# Patient Record
Sex: Male | Born: 1975 | Race: Black or African American | Hispanic: No | Marital: Married | Smoking: Never smoker
Health system: Southern US, Community
[De-identification: ages and names within clinical notes are randomized; demographics above are authoritative.]

## PROBLEM LIST (undated history)

## (undated) DIAGNOSIS — M7500 Adhesive capsulitis of unspecified shoulder: Secondary | ICD-10-CM

## (undated) DIAGNOSIS — E119 Type 2 diabetes mellitus without complications: Secondary | ICD-10-CM

## (undated) DIAGNOSIS — E785 Hyperlipidemia, unspecified: Secondary | ICD-10-CM

## (undated) HISTORY — DX: Hyperlipidemia, unspecified: E78.5

## (undated) HISTORY — DX: Type 2 diabetes mellitus without complications: E11.9

## (undated) HISTORY — DX: Adhesive capsulitis of unspecified shoulder: M75.00

## (undated) HISTORY — PX: BRAIN SURGERY: SHX531

---

## 2014-09-05 ENCOUNTER — Ambulatory Visit: Payer: Self-pay | Admitting: Internal Medicine

## 2014-10-25 ENCOUNTER — Encounter: Payer: Self-pay | Admitting: Internal Medicine

## 2014-10-25 ENCOUNTER — Ambulatory Visit (INDEPENDENT_AMBULATORY_CARE_PROVIDER_SITE_OTHER): Payer: 59 | Admitting: Internal Medicine

## 2014-10-25 VITALS — BP 110/76 | HR 88 | Temp 98.1°F | Resp 18 | Ht 76.0 in | Wt 193.0 lb

## 2014-10-25 DIAGNOSIS — R55 Syncope and collapse: Secondary | ICD-10-CM

## 2014-10-25 DIAGNOSIS — R1013 Epigastric pain: Secondary | ICD-10-CM | POA: Diagnosis not present

## 2014-10-25 DIAGNOSIS — M2550 Pain in unspecified joint: Secondary | ICD-10-CM

## 2014-10-25 DIAGNOSIS — R05 Cough: Secondary | ICD-10-CM | POA: Diagnosis not present

## 2014-10-25 DIAGNOSIS — R569 Unspecified convulsions: Secondary | ICD-10-CM

## 2014-10-25 DIAGNOSIS — E1169 Type 2 diabetes mellitus with other specified complication: Secondary | ICD-10-CM

## 2014-10-25 DIAGNOSIS — R059 Cough, unspecified: Secondary | ICD-10-CM

## 2014-10-25 DIAGNOSIS — Z8709 Personal history of other diseases of the respiratory system: Secondary | ICD-10-CM | POA: Diagnosis not present

## 2014-10-25 NOTE — Patient Instructions (Signed)
Will call with referral appt  Will call with lab/imaging results  Continue metformin for now. May need to adjust medication pending lab results  No flu shot given today due to uncontrolled seizures  Follow up in 1 month for CPE.

## 2014-10-25 NOTE — Addendum Note (Signed)
Addended byParticia Lather C on: 10/25/2014 10:22 AM   Modules accepted: Orders
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
## 2014-10-26 LAB — COMPREHENSIVE METABOLIC PANEL
ALBUMIN: 4.7 g/dL (ref 3.5–5.5)
ALK PHOS: 60 IU/L (ref 39–117)
ALT: 27 IU/L (ref 0–44)
AST: 17 IU/L (ref 0–40)
Albumin/Globulin Ratio: 1.6 (ref 1.1–2.5)
BUN/Creatinine Ratio: 12 (ref 8–19)
BUN: 9 mg/dL (ref 6–20)
Bilirubin Total: 0.3 mg/dL (ref 0.0–1.2)
CALCIUM: 9.5 mg/dL (ref 8.7–10.2)
CO2: 25 mmol/L (ref 18–29)
CREATININE: 0.78 mg/dL (ref 0.76–1.27)
Chloride: 97 mmol/L (ref 97–108)
GFR calc Af Amer: 133 mL/min/{1.73_m2} (ref >59)
GFR, EST NON AFRICAN AMERICAN: 115 mL/min/{1.73_m2} (ref >59)
GLUCOSE: 209 mg/dL — AB (ref 65–99)
Globulin, Total: 3 g/dL (ref 1.5–4.5)
Potassium: 4.3 mmol/L (ref 3.5–5.2)
Sodium: 139 mmol/L (ref 134–144)
Total Protein: 7.7 g/dL (ref 6.0–8.5)

## 2014-10-26 LAB — LIPID PANEL
CHOL/HDL RATIO: 6.2 ratio — AB (ref 0.0–5.0)
CHOLESTEROL TOTAL: 211 mg/dL — AB (ref 100–199)
HDL: 34 mg/dL — ABNORMAL LOW (ref >39)
LDL CALC: 109 mg/dL — AB (ref 0–99)
TRIGLYCERIDES: 339 mg/dL — AB (ref 0–149)
VLDL CHOLESTEROL CAL: 68 mg/dL — AB (ref 5–40)

## 2014-10-26 LAB — CBC WITH DIFFERENTIAL/PLATELET
BASOS ABS: 0 10*3/uL (ref 0.0–0.2)
Basos: 0 %
EOS (ABSOLUTE): 0.1 10*3/uL (ref 0.0–0.4)
EOS: 2 %
HEMATOCRIT: 39.8 % (ref 37.5–51.0)
HEMOGLOBIN: 13.3 g/dL (ref 12.6–17.7)
IMMATURE GRANULOCYTES: 0 %
Immature Grans (Abs): 0 10*3/uL (ref 0.0–0.1)
LYMPHS ABS: 2.6 10*3/uL (ref 0.7–3.1)
LYMPHS: 48 %
MCH: 28.8 pg (ref 26.6–33.0)
MCHC: 33.4 g/dL (ref 31.5–35.7)
MCV: 86 fL (ref 79–97)
MONOCYTES: 7 %
Monocytes Absolute: 0.4 10*3/uL (ref 0.1–0.9)
NEUTROS PCT: 43 %
Neutrophils Absolute: 2.3 10*3/uL (ref 1.4–7.0)
Platelets: 234 10*3/uL (ref 150–379)
RBC: 4.62 x10E6/uL (ref 4.14–5.80)
RDW: 15.3 % (ref 12.3–15.4)
WBC: 5.4 10*3/uL (ref 3.4–10.8)

## 2014-10-26 LAB — MICROALBUMIN / CREATININE URINE RATIO
CREATININE, UR: 213.3 mg/dL
MICROALB/CREAT RATIO: 4.5 mg/g{creat} (ref 0.0–30.0)
MICROALBUM., U, RANDOM: 9.5 ug/mL

## 2014-10-26 LAB — URINALYSIS, ROUTINE W REFLEX MICROSCOPIC
Bilirubin, UA: NEGATIVE
Ketones, UA: NEGATIVE
Leukocytes, UA: NEGATIVE
NITRITE UA: NEGATIVE
PH UA: 5.5 (ref 5.0–7.5)
Protein, UA: NEGATIVE
RBC, UA: NEGATIVE
Specific Gravity, UA: 1.023 (ref 1.005–1.030)
UUROB: 0.2 mg/dL (ref 0.2–1.0)

## 2014-10-26 LAB — HEMOGLOBIN A1C
Est. average glucose Bld gHb Est-mCnc: 200 mg/dL
Hgb A1c MFr Bld: 8.6 % — ABNORMAL HIGH (ref 4.8–5.6)

## 2014-10-26 LAB — TSH: TSH: 1 u[IU]/mL (ref 0.450–4.500)

## 2014-10-27 ENCOUNTER — Telehealth: Payer: Self-pay

## 2014-10-27 DIAGNOSIS — E7800 Pure hypercholesterolemia, unspecified: Secondary | ICD-10-CM

## 2014-10-27 MED ORDER — SIMVASTATIN 10 MG PO TABS: 10.0000 mg | ORAL_TABLET | Freq: Every day | ORAL | Status: DC

## 2014-10-27 MED ORDER — METFORMIN HCL ER (MOD) 1000 MG PO TB24: 1000.0000 mg | ORAL_TABLET | Freq: Every day | ORAL | Status: DC

## 2014-10-27 NOTE — Telephone Encounter (Signed)
RX sent

## 2014-10-27 NOTE — Telephone Encounter (Signed)
Spoke with patient, patient verbalized understanding of results. RX for simvastatin sent to pharmacy. Future order placed for recheck on labs. Appointment for labs scheduled for 11/24/14. Copy of labs mailed   Patient would like for Dr.Carter to rx extended release metformin because he was on regular metformin before and experienced a lot of abdominal/GI issues. Please advise

## 2014-10-27 NOTE — Telephone Encounter (Signed)
-----   Message from [place], [place] sent at 10/26/2014  6:34 PM EDT ----- DM uncontrolled - start Metformin  #60 1 tab po BID w 4 RF; no diabetic kidney disease but does have sugar in urine; cholesterol uncontrolled - start simvastatin 10 mg #30 take 1 po qhs with 4 RF; reck fasting lipid panel and ALT in 1 month; nml thyroid kidney and LFTS; blood counts stable

## 2014-10-27 NOTE — Telephone Encounter (Signed)
Ok to change metformin to ER metformin  daily #30 with 3RF

## 2014-10-30 ENCOUNTER — Other Ambulatory Visit: Payer: Self-pay | Admitting: *Deleted

## 2014-10-30 MED ORDER — AMBULATORY NON FORMULARY MEDICATION: Status: DC

## 2014-10-30 NOTE — Telephone Encounter (Signed)
[facility] sent a fax regarding Metformin MOD ER  was sent in and they cannot get the Metformin MOD ER. I checked telephone message from Dr. Montez Morita and it stated Metformin ER . Changed and faxed to pharmacy.

## 2014-10-31 ENCOUNTER — Ambulatory Visit: Payer: Self-pay | Admitting: Neurology

## 2014-10-31 ENCOUNTER — Other Ambulatory Visit: Payer: Self-pay | Admitting: *Deleted

## 2014-10-31 MED ORDER — AMBULATORY NON FORMULARY MEDICATION: Status: DC

## 2014-10-31 NOTE — Telephone Encounter (Signed)
[facility] Metformin ER MOD  cost over $3000. Changed to Metformin ER 

## 2014-11-03 ENCOUNTER — Inpatient Hospital Stay: Admission: RE | Admit: 2014-11-03 | Payer: Self-pay | Source: Ambulatory Visit

## 2014-11-10 ENCOUNTER — Inpatient Hospital Stay: Admission: RE | Admit: 2014-11-10 | Payer: Self-pay | Source: Ambulatory Visit

## 2014-11-24 ENCOUNTER — Other Ambulatory Visit: Payer: Self-pay

## 2014-11-27 ENCOUNTER — Other Ambulatory Visit: Payer: Self-pay

## 2014-11-28 ENCOUNTER — Other Ambulatory Visit: Payer: 59

## 2014-11-29 ENCOUNTER — Inpatient Hospital Stay: Admission: RE | Admit: 2014-11-29 | Payer: Self-pay | Source: Ambulatory Visit

## 2014-12-01 ENCOUNTER — Other Ambulatory Visit: Payer: 59

## 2014-12-01 DIAGNOSIS — E7800 Pure hypercholesterolemia, unspecified: Secondary | ICD-10-CM

## 2014-12-02 LAB — LIPID PANEL
Chol/HDL Ratio: 5.4 ratio units — ABNORMAL HIGH (ref 0.0–5.0)
Cholesterol, Total: 185 mg/dL (ref 100–199)
HDL: 34 mg/dL — ABNORMAL LOW (ref >39)
LDL Calculated: 98 mg/dL (ref 0–99)
Triglycerides: 266 mg/dL — ABNORMAL HIGH (ref 0–149)
VLDL Cholesterol Cal: 53 mg/dL — ABNORMAL HIGH (ref 5–40)

## 2014-12-02 LAB — ALT: ALT: 24 IU/L (ref 0–44)

## 2014-12-04 ENCOUNTER — Ambulatory Visit (INDEPENDENT_AMBULATORY_CARE_PROVIDER_SITE_OTHER): Payer: 59 | Admitting: Neurology

## 2014-12-04 ENCOUNTER — Encounter: Payer: Self-pay | Admitting: Neurology

## 2014-12-04 VITALS — BP 108/78 | HR 70 | Resp 16 | Ht 76.0 in | Wt 192.0 lb

## 2014-12-04 DIAGNOSIS — G40909 Epilepsy, unspecified, not intractable, without status epilepticus: Secondary | ICD-10-CM

## 2014-12-04 DIAGNOSIS — R351 Nocturia: Secondary | ICD-10-CM | POA: Diagnosis not present

## 2014-12-04 DIAGNOSIS — G4719 Other hypersomnia: Secondary | ICD-10-CM | POA: Diagnosis not present

## 2014-12-04 DIAGNOSIS — R51 Headache: Secondary | ICD-10-CM | POA: Diagnosis not present

## 2014-12-04 DIAGNOSIS — R0681 Apnea, not elsewhere classified: Secondary | ICD-10-CM | POA: Diagnosis not present

## 2014-12-04 DIAGNOSIS — R519 Headache, unspecified: Secondary | ICD-10-CM

## 2014-12-04 NOTE — Patient Instructions (Signed)
Based on your symptoms and your exam I believe you are at risk for obstructive sleep apnea or OSA, and I think we should proceed with a sleep study to determine whether you do or do not have OSA and how severe it is. If you have more than mild OSA, I want you to consider treatment with CPAP. Please remember, the risks and ramifications of moderate to severe obstructive sleep apnea or OSA are: Cardiovascular disease, including congestive heart failure, stroke, difficult to control hypertension, arrhythmias, and even type 2 diabetes has been linked to untreated OSA. Sleep apnea causes disruption of sleep and sleep deprivation in most cases, which, in turn, can cause recurrent headaches, problems with memory, mood, concentration, focus, and vigilance. Most people with untreated sleep apnea report excessive daytime sleepiness, which can affect their ability to drive. Please do not drive if you feel sleepy.   I will likely see you back after your sleep study to go over the test results and where to go from there. We will call you after your sleep study to advise about the results (most likely, you will hear from Lafonda Mossesiana, my nurse) and to set up an appointment at the time, as necessary.    Our sleep lab administrative assistant, Alvis LemmingsDawn will meet with you or call you to schedule your sleep study. If you don't hear back from her by next week please feel free to call her at [phone]. This is her direct line and please leave a message with your phone number to call back if you get the voicemail box. She will call back as soon as possible.   [facility] to reschedule your appointment with Dr. Karel JarvisAquino - she is an epilepsy specialist and you would benefit from seeing her for this.

## 2014-12-04 NOTE — Progress Notes (Signed)
Subjective:    Patient ID: Donald Simmons is a 39 y.o. male.  HPI     Huston Foley, MD, PhD [facility] [address] [address] [place], [place] [address]  Dear Dr. Earlene Plater,  I saw your patient, Donald Simmons, upon your kind request in my neurologic clinic today for initial consultation of his sleep disorder, in particular, concern for underlying obstructive sleep apnea. The patient is unaccompanied today. As you know, [name] is a 39 year old right-handed gentleman with an underlying medical history of arthritis, diabetes and seizure disorder (referred to Dr. Karel Jarvis at [facility] neuro, epilepsy specialist), who reports excessive daytime somnolence. He reports difficulty with sleep initiation and difficulty with sleep maintenance. He does not snore on a night to night basis. He has had apneic pauses while asleep per wife's report and also reports waking up with a sense of gasping at times. His Epworth sleepiness score is 17 out of 24 today, his fatigue score is 63 out of 63. He does not keep a sleep schedule necessarily. He tries to be in bed around 2 AM or 2:30 AM. Trying to go to bed earlier results and early awakening. He has trouble maintaining sleep. He goes to the bathroom multiple times, around 8-10 times on an average night. He was told in the past but this is secondary to his diabetes. On 10/25/2014 his A1c was 8.6. He has been on metformin and is supposed to start simvastatin. He is currently not on seizure medication. He's willing to make another appointment with Dr. Karel Jarvis and is waiting for a phone call back from her office. He has tried different medications for seizures in the past but does not recall all of them. He recalls trying gabapentin and carbamazepine. He has not driven a car in over a year. His rise time varies, currently around 5:30 or 6 AM. He does not wake up rested. Sometimes he has a headache in the morning. He drinks caffeine in the  form of coffee occasionally. He does not work. He is married and lives with his wife, his 49-year-old son and his wife's mother. He has no significant stress he reports. He denies restless leg symptoms but has some leg pains at night. He has no significant issues with leg twitching in his sleep. He has had twitching of his body during sleep. He has no family history of obstructive sleep apnea. He does not drink alcohol. He does not smoke cigarettes or use drugs. He does not watch TV in bed.   His Past Medical History Is Significant For: Past Medical History  Diagnosis Date  . Diabetes mellitus without complication (HCC)   . Hyperlipemia   . Hyperlipemia     His Past Surgical History Is Significant For: No past surgical history on file.  His Family History Is Significant For: Family History  Problem Relation Age of Onset  . Thyroid disease Mother     His Social History Is Significant For: Social History   Social History  . Marital Status: Married    Spouse Name: N/A  . Number of Children: 4  . Years of Education: College   Occupational History  . N/A    Social History Main Topics  . Smoking status: Never Smoker   . Smokeless tobacco: Never Used  . Alcohol Use: 0.0 oz/week    0 Glasses of wine per week  . Drug Use: No  . Sexual Activity: Not Asked   Other Topics Concern  .  None   Social History Narrative   Diet:      Do you drink/ eat things with caffeine? Drinks about 1 cup of coffee every 2 days      Marital status:   Married                            What year were you married ? 2013      Do you live in a house, apartment,assistred living, condo, trailer, etc.)?House      Is it one or more stories? 1 storie      How many persons live in your home ? 4      Do you have any pets in your home ?(please list) No      Current or past profession: Many      Do you exercise?   No                           Type & how often:      Do you have a living will?      Do  you have a DNR form?                       If not, do you want to discuss one?       Do you have signed POA?HPOA forms?   No              If so, please bring to your        appointment          His Allergies Are:  Allergies  Allergen Reactions  . Asa [Aspirin] Hives  :   His Current Medications Are:  Outpatient Encounter Prescriptions as of 12/04/2014  Medication Sig  . AMBULATORY NON FORMULARY MEDICATION Metformin ER  Sig: Take one tablet by mouth once daily with breakfast to control blood sugar  . simvastatin (ZOCOR) 10 MG tablet Take 1 tablet (10 mg total) by mouth daily. For High Chlosterol   No facility-administered encounter medications on file as of 12/04/2014.  :  Review of Systems:  Out of a complete 14 point review of systems, all are reviewed and negative with the exception of these symptoms as listed below:   Review of Systems  Constitutional: Positive for fever, chills and fatigue.  Eyes:       Blurred vision   Cardiovascular: Positive for leg swelling.  Neurological: Positive for dizziness, seizures, syncope, weakness and numbness.       Trouble falling and staying asleep, witnessed apnea, wakes up coughing and choking, wakes up feeling tired, daytime tiredness, denies taking naps. Memory loss. Restless legs.    Psychiatric/Behavioral: Positive for confusion.       Not enough sleep, decreased energy, disinterest in activities    Epworth Sleepiness Scale 0= would never doze 1= slight chance of dozing 2= moderate chance of dozing 3= high chance of dozing  Sitting and reading:1 Watching TV:2 Sitting inactive in a public place (ex. Theater or meeting):3 As a passenger in a car for an hour without a break:3 Lying down to rest in the afternoon:3 Sitting and talking to someone:0 Sitting quietly after lunch (no alcohol):3 In a car, while stopped in traffic:2 Total:17  Objective:  Neurologic Exam  Physical Exam Physical Examination:   Filed  Vitals:   12/04/14 1332  BP: 108/78  Pulse: 70  Resp:  16    General Examination: The patient is a very pleasant 39 y.o. male in no acute distress. He appears well-developed and well-nourished and adequately groomed. He is rather quiet, not very forthcoming with information.  HEENT: Normocephalic, atraumatic, pupils are equal, round and reactive to light and accommodation. Funduscopic exam is normal with sharp disc margins noted. Extraocular tracking is good without limitation to gaze excursion or nystagmus noted. Normal smooth pursuit is noted. Hearing is grossly intact. Tympanic membranes are clear bilaterally. Face is symmetric with normal facial animation and normal facial sensation. Speech is clear with no dysarthria noted. There is no hypophonia. There is no lip, neck/head, jaw or voice tremor. Neck is supple with full range of passive and active motion. There are no carotid bruits on auscultation. Oropharynx exam reveals: mild mouth dryness, adequate dental hygiene and moderate airway crowding, due to tonsils are 2+, relatively larger appearing uvula and small airway entry. Mallampati is class II. Neck circumference is 15 inches. Tongue protrudes centrally and palate elevates symmetrically.   Chest: Clear to auscultation without wheezing, rhonchi or crackles noted.  Heart: S1+S2+0, regular and normal without murmurs, rubs or gallops noted.   Abdomen: Soft, non-tender and non-distended with normal bowel sounds appreciated on auscultation.  Extremities: There is no pitting edema in the distal lower extremities bilaterally. Pedal pulses are intact.  Skin: Warm and dry without trophic changes noted. There are no varicose veins.  Musculoskeletal: exam reveals no obvious joint deformities, tenderness or joint swelling or erythema.   Neurologically:  Mental status: The patient is awake, alert and oriented in all 4 spheres. His immediate and remote memory, attention, language skills and fund of  knowledge are appropriate. There is no evidence of aphasia, agnosia, apraxia or anomia. Speech is clear with normal prosody and enunciation. Thought process is linear. Mood is normal and affect is blunted.  Cranial nerves II - XII are as described above under HEENT exam. In addition: shoulder shrug is normal with equal shoulder height noted. Motor exam: Normal bulk, strength and tone is noted. There is no drift, tremor or rebound. Romberg is negative. Reflexes are 1+  in the upper extremities and trace in the lower extremities. Fine motor skills and coordination: intact in the upper and lower extremities. Cerebellar testing shows no dysmetria or intention tremor on finger to nose testing. Heel to shin is unremarkable bilaterally. There is no truncal or gait ataxia.  Sensory exam: intact to light touch in the upper and lower extremities bilaterally.  Gait, station and balance: He stands with mild difficulty. No veering to one side is noted. No leaning to one side is noted. Posture is age-appropriate and stance is narrow based. Gait shows normal stride length and normal pace. No problems turning are noted. He turns en bloc. Tandem walk is slightly difficult for him.   Assessment and Plan:   In summary, Lowella FairyChristopher Rho is a very pleasant 39 y.o.-year old male with an underlying medical history of arthritis, diabetes and seizure disorder (referred to [facility]qu[facility] at [facility], [facility] specialist), who reports excessive daytime somnolence. His history and physical exam are indeed concerning for underlying obstructive sleep apnea (OSA). He missed an appointment with [facility] at [facility], epilepsy specialist, and is encouraged to reschedule that appointment as he would benefit from seeing an epilepsy specialist. He said he is waiting for them to call him for his appointment. He has not driven a car in over a year. He knows not to  drive with a seizure diagnosis. He is unclear or vague about when  his last seizure was and also vague about what medications he has tried. He used to see a neurologist and lumbar tenderness I understand. I had a long chat with the patient about my findings and the diagnosis of OSA, its prognosis and treatment options. We talked about medical treatments, surgical interventions and non-pharmacological approaches. I explained in particular the risks and ramifications of untreated moderate to severe OSA, especially with respect to developing cardiovascular disease down the Road, including congestive heart failure, difficult to treat hypertension, cardiac arrhythmias, or stroke. Even type 2 diabetes has, in part, been linked to untreated OSA. Symptoms of untreated OSA include daytime sleepiness, memory problems, mood irritability and mood disorder such as depression and anxiety, lack of energy, as well as recurrent headaches, especially morning headaches. We talked about trying to maintain a healthy lifestyle in general, as well as the importance of weight control. I encouraged the patient to eat healthy, exercise daily and keep well hydrated, to keep a scheduled bedtime and wake time routine, to not skip any meals and eat healthy snacks in between meals. I advised the patient not to drive when feeling sleepy. As mentioned, he is currently not driving. I recommended the following at this time: sleep study with potential positive airway pressure titration. (We will score hypopneas at 4% and split the sleep study into diagnostic and treatment portion, if the estimated. 2 hour AHI is >20/h).   I explained the sleep test procedure to the patient and also outlined possible surgical and non-surgical treatment options of OSA, including the use of a custom-made dental device (which would require a referral to a specialist dentist or oral surgeon), upper airway surgical options, such as pillar implants, radiofrequency surgery, tongue base surgery, and UPPP (which would involve a referral to  an ENT surgeon). Rarely, jaw surgery such as mandibular advancement may be considered.  I also explained the CPAP treatment option to the patient, who indicated that he would be willing to try CPAP if the need arises. I explained the importance of being compliant with PAP treatment, not only for insurance purposes but primarily to improve His symptoms, and for the patient's long term health benefit, including to reduce His cardiovascular risks. I answered all his questions today and the patient was in agreement. I would like to see him back after the sleep study is completed and encouraged him to call with any interim questions, concerns, problems or updates.   Thank you very much for allowing me to participate in the care of this nice patient. If I can be of any further assistance to you please do not hesitate to call me at [phone].  Sincerely,   Huston Foley, MD, PhD

## 2014-12-05 ENCOUNTER — Other Ambulatory Visit: Payer: Self-pay

## 2014-12-05 ENCOUNTER — Ambulatory Visit
Admission: RE | Admit: 2014-12-05 | Discharge: 2014-12-05 | Disposition: A | Discharge status: Home / Self Care | Payer: 59 | Source: Ambulatory Visit | Attending: Internal Medicine | Admitting: Internal Medicine

## 2014-12-05 DIAGNOSIS — R1013 Epigastric pain: Secondary | ICD-10-CM

## 2014-12-05 DIAGNOSIS — R3989 Other symptoms and signs involving the genitourinary system: Secondary | ICD-10-CM

## 2014-12-05 MED ORDER — IOPAMIDOL (ISOVUE-300) INJECTION 61%
100.0000 mL | Freq: Once | INTRAVENOUS | Status: AC | PRN
  Administered 2014-12-05: 100 mL via INTRAVENOUS

## 2014-12-06 ENCOUNTER — Telehealth: Payer: Self-pay | Admitting: *Deleted

## 2014-12-06 ENCOUNTER — Encounter: Payer: 59 | Admitting: Internal Medicine

## 2014-12-06 NOTE — Telephone Encounter (Signed)
Received fax from [facility] for Prior Auth for Metformin ER 1000mg  Once daily. Filled out what I could and given to Dr. Montez Moritaarter to review and sign. To be faxed back to Fax#: [phone] #: [phone] Patient ID#: [id number]

## 2014-12-07 ENCOUNTER — Other Ambulatory Visit: Payer: Self-pay

## 2014-12-07 ENCOUNTER — Other Ambulatory Visit: Payer: 59

## 2014-12-07 ENCOUNTER — Telehealth: Payer: Self-pay

## 2014-12-07 DIAGNOSIS — R3989 Other symptoms and signs involving the genitourinary system: Secondary | ICD-10-CM

## 2014-12-07 MED ORDER — METFORMIN HCL 1000 MG PO TABS: 1000.0000 mg | ORAL_TABLET | Freq: Two times a day (BID) | ORAL | Status: DC

## 2014-12-07 NOTE — Telephone Encounter (Signed)
Prior Authorization for Metfomin was give to Dr.Carter for completion. Per Dr.Carter change Metformin ER to plain metformin 1000 mg BID.  New RX sent. Message was left on voicemail for pharmacy informing them of this change.

## 2014-12-14 LAB — SPECIMEN STATUS REPORT

## 2014-12-14 LAB — PSA: Prostate Specific Ag, Serum: 0.5 ng/mL (ref 0.0–4.0)

## 2014-12-21 ENCOUNTER — Encounter: Payer: Self-pay | Admitting: Internal Medicine

## 2014-12-22 ENCOUNTER — Encounter: Payer: Self-pay | Admitting: Internal Medicine

## 2014-12-22 ENCOUNTER — Encounter: Payer: 59 | Admitting: Internal Medicine

## 2014-12-23 NOTE — Progress Notes (Signed)
This encounter was created in error - please disregard.

## 2015-01-09 ENCOUNTER — Telehealth: Payer: Self-pay | Admitting: Neurology

## 2015-01-09 ENCOUNTER — Telehealth: Payer: Self-pay | Admitting: *Deleted

## 2015-01-09 DIAGNOSIS — G4719 Other hypersomnia: Secondary | ICD-10-CM

## 2015-01-09 DIAGNOSIS — R51 Headache: Secondary | ICD-10-CM

## 2015-01-09 DIAGNOSIS — R519 Headache, unspecified: Secondary | ICD-10-CM

## 2015-01-09 DIAGNOSIS — R351 Nocturia: Secondary | ICD-10-CM

## 2015-01-09 DIAGNOSIS — G40909 Epilepsy, unspecified, not intractable, without status epilepticus: Secondary | ICD-10-CM

## 2015-01-09 DIAGNOSIS — R0681 Apnea, not elsewhere classified: Secondary | ICD-10-CM

## 2015-01-09 MED ORDER — METFORMIN HCL ER 500 MG PO TB24
ORAL_TABLET | ORAL | Status: DC
Start: 2015-01-09 — End: 2015-08-17

## 2015-01-09 NOTE — Telephone Encounter (Signed)
Order has been placed.

## 2015-01-09 NOTE — Telephone Encounter (Signed)
Faxed to pharmacy

## 2015-01-09 NOTE — Telephone Encounter (Signed)
Patient called and stated that he wants to take Metformin ER 500mg  One tablet twice daily. Patient stated that he had problems in the past with taking Metformin 1000mg  you changed him to. Would like to be on Metformin ER 500mg . Please Advise.

## 2015-01-09 NOTE — Telephone Encounter (Signed)
Ok to change to metformin ER 500mg  take 2 tabs po daily #60 with 4RF

## 2015-01-09 NOTE — Telephone Encounter (Signed)
[employer] denied split study.  HST is approved.  Can I get an order for HST?

## 2015-01-10 NOTE — Telephone Encounter (Signed)
Patient notified

## 2015-01-15 ENCOUNTER — Encounter (INDEPENDENT_AMBULATORY_CARE_PROVIDER_SITE_OTHER): Payer: 59 | Admitting: Neurology

## 2015-01-15 DIAGNOSIS — G4719 Other hypersomnia: Secondary | ICD-10-CM

## 2015-01-15 DIAGNOSIS — R51 Headache: Secondary | ICD-10-CM

## 2015-01-15 DIAGNOSIS — G4710 Hypersomnia, unspecified: Secondary | ICD-10-CM | POA: Diagnosis not present

## 2015-01-15 DIAGNOSIS — G40909 Epilepsy, unspecified, not intractable, without status epilepticus: Secondary | ICD-10-CM

## 2015-01-15 DIAGNOSIS — R351 Nocturia: Secondary | ICD-10-CM

## 2015-01-15 DIAGNOSIS — R519 Headache, unspecified: Secondary | ICD-10-CM

## 2015-01-15 DIAGNOSIS — R0681 Apnea, not elsewhere classified: Secondary | ICD-10-CM

## 2015-01-16 ENCOUNTER — Telehealth: Payer: Self-pay | Admitting: Neurology

## 2015-01-17 NOTE — Telephone Encounter (Signed)
Please call and notify the patient that the recent home sleep test did not show any significant obstructive sleep apnea. Patient can follow up with the referring provider (dentist). A copy of the report will be sent to the patient, the PCP and referring DMD.  Once you have spoken to patient, you can close this encounter.   Thanks,  Huston FoleySaima Galo Sayed, MD, PhD [facility]

## 2015-01-17 NOTE — Telephone Encounter (Signed)
I spoke to patient and he is aware of results and recommendation. He is aware that we will send report to Dr. Cleta Albertsaub and Kirt BoysMonica Carter and he will f/u with them.

## 2015-02-07 ENCOUNTER — Encounter: Payer: Self-pay | Admitting: Internal Medicine

## 2015-02-07 ENCOUNTER — Ambulatory Visit (INDEPENDENT_AMBULATORY_CARE_PROVIDER_SITE_OTHER): Payer: 59 | Admitting: Internal Medicine

## 2015-02-07 VITALS — BP 110/62 | HR 88 | Temp 98.2°F | Resp 18 | Ht 73.0 in | Wt 191.6 lb

## 2015-02-07 DIAGNOSIS — E1140 Type 2 diabetes mellitus with diabetic neuropathy, unspecified: Secondary | ICD-10-CM | POA: Diagnosis not present

## 2015-02-07 DIAGNOSIS — M2550 Pain in unspecified joint: Secondary | ICD-10-CM | POA: Diagnosis not present

## 2015-02-07 DIAGNOSIS — Z0000 Encounter for general adult medical examination without abnormal findings: Secondary | ICD-10-CM

## 2015-02-07 DIAGNOSIS — Z8709 Personal history of other diseases of the respiratory system: Secondary | ICD-10-CM

## 2015-02-07 DIAGNOSIS — K5900 Constipation, unspecified: Secondary | ICD-10-CM

## 2015-02-07 DIAGNOSIS — E785 Hyperlipidemia, unspecified: Secondary | ICD-10-CM

## 2015-02-07 DIAGNOSIS — R569 Unspecified convulsions: Secondary | ICD-10-CM | POA: Diagnosis not present
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
## 2015-02-07 NOTE — Patient Instructions (Signed)
Take linzess 145mg  1 tab daily for constipation  Will call with lab results  Will call with PT appt  Follow up in 2 mos for routine visit  Handicap placard form completed

## 2015-02-08 LAB — SEDIMENTATION RATE: SED RATE: 9 mm/h (ref 0–15)

## 2015-02-08 LAB — BASIC METABOLIC PANEL
BUN/Creatinine Ratio: 21 — ABNORMAL HIGH (ref 8–19)
BUN: 14 mg/dL (ref 6–20)
CO2: 24 mmol/L (ref 18–29)
CREATININE: 0.68 mg/dL — AB (ref 0.76–1.27)
Calcium: 9.6 mg/dL (ref 8.7–10.2)
Chloride: 97 mmol/L (ref 96–106)
GFR calc Af Amer: 141 mL/min/{1.73_m2} (ref >59)
GFR calc non Af Amer: 122 mL/min/{1.73_m2} (ref >59)
GLUCOSE: 269 mg/dL — AB (ref 65–99)
POTASSIUM: 4.1 mmol/L (ref 3.5–5.2)
SODIUM: 139 mmol/L (ref 134–144)

## 2015-02-08 LAB — ALT: ALT: 32 IU/L (ref 0–44)

## 2015-02-08 LAB — LIPID PANEL
CHOLESTEROL TOTAL: 202 mg/dL — AB (ref 100–199)
Chol/HDL Ratio: 6.1 ratio units — ABNORMAL HIGH (ref 0.0–5.0)
HDL: 33 mg/dL — ABNORMAL LOW (ref >39)
LDL Calculated: 93 mg/dL (ref 0–99)
Triglycerides: 380 mg/dL — ABNORMAL HIGH (ref 0–149)
VLDL Cholesterol Cal: 76 mg/dL — ABNORMAL HIGH (ref 5–40)

## 2015-02-08 LAB — URIC ACID: Uric Acid: 3.5 mg/dL — ABNORMAL LOW (ref 3.7–8.6)

## 2015-02-08 LAB — HEMOGLOBIN A1C
Est. average glucose Bld gHb Est-mCnc: 237 mg/dL
Hgb A1c MFr Bld: 9.9 % — ABNORMAL HIGH (ref 4.8–5.6)

## 2015-02-08 LAB — ANA: ANA: NEGATIVE

## 2015-02-12 ENCOUNTER — Other Ambulatory Visit: Payer: Self-pay | Admitting: *Deleted

## 2015-02-12 DIAGNOSIS — E7800 Pure hypercholesterolemia, unspecified: Secondary | ICD-10-CM

## 2015-02-12 MED ORDER — INSULIN GLARGINE 300 UNIT/ML ~~LOC~~ SOPN: 10.0000 [IU] | PEN_INJECTOR | Freq: Every day | SUBCUTANEOUS | Status: DC

## 2015-02-12 MED ORDER — SIMVASTATIN 10 MG PO TABS: 10.0000 mg | ORAL_TABLET | Freq: Every day | ORAL | Status: DC

## 2015-02-14 ENCOUNTER — Ambulatory Visit (INDEPENDENT_AMBULATORY_CARE_PROVIDER_SITE_OTHER): Payer: 59 | Admitting: Neurology

## 2015-02-14 ENCOUNTER — Encounter: Payer: Self-pay | Admitting: Neurology

## 2015-02-14 ENCOUNTER — Ambulatory Visit: Payer: 59

## 2015-02-14 VITALS — BP 118/82 | HR 82 | Ht 74.0 in | Wt 195.0 lb

## 2015-02-14 DIAGNOSIS — G40009 Localization-related (focal) (partial) idiopathic epilepsy and epileptic syndromes with seizures of localized onset, not intractable, without status epilepticus: Secondary | ICD-10-CM | POA: Insufficient documentation

## 2015-02-14 MED ORDER — LAMOTRIGINE ER 25 MG PO TB24: ORAL_TABLET | ORAL | Status: DC
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
## 2015-02-19 ENCOUNTER — Ambulatory Visit: Payer: 59 | Admitting: Pharmacotherapy

## 2015-02-20 ENCOUNTER — Ambulatory Visit (INDEPENDENT_AMBULATORY_CARE_PROVIDER_SITE_OTHER): Payer: 59 | Admitting: Neurology

## 2015-02-20 DIAGNOSIS — G40009 Localization-related (focal) (partial) idiopathic epilepsy and epileptic syndromes with seizures of localized onset, not intractable, without status epilepticus: Secondary | ICD-10-CM

## 2015-02-21 ENCOUNTER — Encounter: Payer: Self-pay | Admitting: Internal Medicine

## 2015-02-21 ENCOUNTER — Encounter: Payer: Self-pay | Admitting: Neurology

## 2015-02-22 ENCOUNTER — Ambulatory Visit: Payer: 59

## 2015-02-27 ENCOUNTER — Ambulatory Visit: Payer: 59 | Admitting: Physical Therapy

## 2015-02-28 ENCOUNTER — Ambulatory Visit (HOSPITAL_COMMUNITY): Admission: RE | Admit: 2015-02-28 | Payer: 59 | Source: Ambulatory Visit

## 2015-03-05 ENCOUNTER — Ambulatory Visit: Payer: 59 | Admitting: Pharmacotherapy

## 2015-03-05 NOTE — Telephone Encounter (Signed)
Discussed 24-hour EEG results, EEG normal, however discussed that EEG can be normal in patients with epilepsy, we still go by history, and by his wife's description, seizures suggestive of partial epilepsy possibly from temporal lobe. He has had insurance issues obtaining Lamictal ER but may be able to get it in 2 days. He will let us know of any problems, otherwise would recommend doing Lamotrigine IR. He is still having seizures. We also talked about his concern that diabetes is causing his seizures, discussed different types of seizures, from history, his type of seizures sound more epileptic rather than metabolic from diabetes. The nerve pain may be due to diabetes, but would not cause seizures. He expressed understanding.

## 2015-03-06 ENCOUNTER — Ambulatory Visit: Payer: 59 | Admitting: Physical Therapy

## 2015-03-12 ENCOUNTER — Ambulatory Visit: Payer: Self-pay | Admitting: Pharmacotherapy

## 2015-03-12 ENCOUNTER — Ambulatory Visit: Payer: 59 | Attending: Internal Medicine

## 2015-03-12 DIAGNOSIS — R6889 Other general symptoms and signs: Secondary | ICD-10-CM | POA: Insufficient documentation

## 2015-03-12 DIAGNOSIS — M6281 Muscle weakness (generalized): Secondary | ICD-10-CM | POA: Insufficient documentation

## 2015-03-12 DIAGNOSIS — M546 Pain in thoracic spine: Secondary | ICD-10-CM | POA: Insufficient documentation

## 2015-03-12 DIAGNOSIS — M791 Myalgia: Secondary | ICD-10-CM | POA: Insufficient documentation

## 2015-03-12 DIAGNOSIS — M25561 Pain in right knee: Secondary | ICD-10-CM | POA: Insufficient documentation

## 2015-03-12 DIAGNOSIS — M2550 Pain in unspecified joint: Secondary | ICD-10-CM | POA: Insufficient documentation

## 2015-03-12 DIAGNOSIS — M7582 Other shoulder lesions, left shoulder: Secondary | ICD-10-CM | POA: Insufficient documentation

## 2015-03-12 DIAGNOSIS — W19XXXD Unspecified fall, subsequent encounter: Secondary | ICD-10-CM | POA: Insufficient documentation

## 2015-03-12 DIAGNOSIS — G8929 Other chronic pain: Secondary | ICD-10-CM | POA: Insufficient documentation

## 2015-03-12 DIAGNOSIS — M25562 Pain in left knee: Secondary | ICD-10-CM | POA: Insufficient documentation

## 2015-03-12 DIAGNOSIS — M25512 Pain in left shoulder: Secondary | ICD-10-CM | POA: Insufficient documentation

## 2015-03-14 ENCOUNTER — Ambulatory Visit (HOSPITAL_COMMUNITY): Admission: RE | Admit: 2015-03-14 | Payer: 59 | Source: Ambulatory Visit

## 2015-03-14 NOTE — Procedures (Signed)
ELECTROENCEPHALOGRAM REPORT  Dates of Recording: 02/20/2015 to 02/21/2015  Patient's Name: Donald Simmons MRN: [id number] Date of Birth: 02/11/1975  Referring Provider: Dr. Patrcia Dolly  Procedure: 24-hour ambulatory EEG  History: This is a 40 year old man with seizures occurring 2-4 times a week where he would stop talking with "weird blinking" and oral automatisms where it looks like he is trying to stretch his jaw, as well as nocturnal convulsions. EEG for classification.  Medications: Lamictal, Insulin, metformin, Zocor  Technical Summary: This is a 24-hour multichannel digital EEG recording measured by the international 10-20 system with electrodes applied with paste and impedances below 5000 ohms performed as portable with EKG monitoring.  The digital EEG was referentially recorded, reformatted, and digitally filtered in a variety of bipolar and referential montages for optimal display.    DESCRIPTION OF RECORDING: During maximal wakefulness, the background activity consisted of a symmetric 10 Hz posterior dominant rhythm which was reactive to eye opening.  There were no epileptiform discharges or focal slowing seen in wakefulness.  During the recording, the patient progresses through wakefulness, drowsiness, and Stage 2 sleep.  Again, there were no epileptiform discharges seen.  Events: On 01/17 between 1230 to 1315 hours, he reports feeling groggy, lethargic, like he would pass out. Electrographically, there were no EEG or EKG changes seen.  On 01/17 at 1855 hours, he reports left upper chest pain. Electrographically, there were no EEG or EKG changes seen.  On 01/17 at 1952 hours, he reports feeling lethargic, out of it, blackout feeling. Electrographically, there were no EEG or EKG changes seen.  On 01/17 at 2035 hours, he reports similar symptoms. Electrographically, there were no EEG or EKG changes seen.  On 01/17 at 2050 hours, he reports blackout feeling.  Electrographically, there were no EEG or EKG changes seen.  On 01/18 at 0000 hours, he reports high stress, extreme pains, restlessness, racing thoughts. Electrographically, there were no EEG or EKG changes seen.  On 01/18 between 0730 to 0820 hours, he reports shoulder pain, pain on left side, upper left chest. Electrographically, there were no EEG or EKG changes seen.  There were no electrographic seizures seen.  EKG lead was unremarkable.  IMPRESSION: This 24-hour ambulatory EEG study is normal.    CLINICAL CORRELATION: A normal EEG does not exclude a clinical diagnosis of epilepsy. Typical episodes were not captured. He reported feeling lethargic, out of it, blackout feeling, left-sided pain, with no EEG correlate.  If further clinical questions remain, inpatient video EEG monitoring may be helpful.   Patrcia Dolly, M.D.

## 2015-03-19 ENCOUNTER — Other Ambulatory Visit: Payer: Self-pay | Admitting: *Deleted

## 2015-03-19 MED ORDER — INSULIN PEN NEEDLE 31G X 5 MM MISC: Status: DC

## 2015-03-19 NOTE — Telephone Encounter (Signed)
Patient wife requested to be sent to pharmacy.  

## 2015-03-20 ENCOUNTER — Telehealth: Payer: Self-pay | Admitting: Family Medicine

## 2015-03-20 MED ORDER — LAMOTRIGINE 25 MG PO TABS: ORAL_TABLET | ORAL | Status: DC

## 2015-03-20 NOTE — Telephone Encounter (Signed)
I spoke with patient's wife about patient's Lamictal XR rx. Medication did require PA, which we got approved but the patients out of pocket is $448.87 because they haven't met their deductible yet. Explained that Dr. Delice Lesch is going to switch him to Lamictal IR for now with the directions as follows. We will see him back in the office for his f/u on 3/3. New Rx sent to his pharmacy.  Lamotrigine '25mg'$  BID: 1 tab BID x 2 weeks, then increase to 2 tabs BID for 2 weeks, then increase to 4 tabs BID and f/u as scheduled.

## 2015-03-23 ENCOUNTER — Telehealth: Payer: Self-pay | Admitting: *Deleted

## 2015-03-23 NOTE — Telephone Encounter (Signed)
Patient wife, Milagros Loll called and requested a Pulmonology Referral for patient due to a Cough. Explained that patient would need to be seen here before we could refer to specialist. Offered appointment but she said they would wait.

## 2015-03-26 ENCOUNTER — Ambulatory Visit: Payer: Self-pay | Admitting: Pharmacotherapy

## 2015-03-26 ENCOUNTER — Encounter: Payer: Self-pay | Admitting: Internal Medicine

## 2015-03-27 ENCOUNTER — Ambulatory Visit: Payer: 59 | Admitting: Physical Therapy

## 2015-03-27 DIAGNOSIS — M546 Pain in thoracic spine: Secondary | ICD-10-CM | POA: Diagnosis present

## 2015-03-27 DIAGNOSIS — M25561 Pain in right knee: Secondary | ICD-10-CM | POA: Diagnosis present

## 2015-03-27 DIAGNOSIS — G8929 Other chronic pain: Secondary | ICD-10-CM | POA: Diagnosis present

## 2015-03-27 DIAGNOSIS — W19XXXD Unspecified fall, subsequent encounter: Secondary | ICD-10-CM | POA: Diagnosis not present

## 2015-03-27 DIAGNOSIS — M25562 Pain in left knee: Secondary | ICD-10-CM | POA: Diagnosis present

## 2015-03-27 DIAGNOSIS — M25512 Pain in left shoulder: Secondary | ICD-10-CM | POA: Diagnosis present

## 2015-03-27 DIAGNOSIS — R29898 Other symptoms and signs involving the musculoskeletal system: Secondary | ICD-10-CM

## 2015-03-27 DIAGNOSIS — M7582 Other shoulder lesions, left shoulder: Secondary | ICD-10-CM | POA: Diagnosis present

## 2015-03-27 DIAGNOSIS — R6889 Other general symptoms and signs: Secondary | ICD-10-CM

## 2015-03-27 DIAGNOSIS — M6281 Muscle weakness (generalized): Secondary | ICD-10-CM | POA: Diagnosis present

## 2015-03-27 DIAGNOSIS — M791 Myalgia, unspecified site: Secondary | ICD-10-CM

## 2015-03-27 DIAGNOSIS — M25612 Stiffness of left shoulder, not elsewhere classified: Secondary | ICD-10-CM

## 2015-03-27 DIAGNOSIS — M2550 Pain in unspecified joint: Secondary | ICD-10-CM | POA: Diagnosis present

## 2015-03-27 NOTE — Patient Instructions (Signed)
Posture Tips DO: - stand tall and erect - keep chin tucked in - keep head and shoulders in alignment - check posture regularly in mirror or large window - pull head back against headrest in car seat;  Change your position often.  Sit with lumbar support. DON'T: - slouch or slump while watching TV or reading - sit, stand or lie in one position  for too long;  Sitting is especially hard on the spine so if you sit at a desk/use the computer, then stand up often!   Copyright  VHI. All rights reserved.  Posture - Standing   Good posture is important. Avoid slouching and forward head thrust. Maintain curve in low back and align ears over shoul- ders, hips over ankles.  Pull your belly button in toward your back bone. Sit with ribs lifted up and chin down.      Copyright  VHI. All rights reserved.  Posture - Sitting   Sit upright, head facing forward. Try using a roll to support lower back. Keep shoulders relaxed, and avoid rounded back. Keep hips level with knees. Avoid crossing legs for long periods.  sit on sit bones and not tailbones.   Copyright  VHI. All rights reserved.   Read for next visit.   Trigger Point Dry Needling  . What is Trigger Point Dry Needling (DN)? o DN is a physical therapy technique used to treat muscle pain and dysfunction. Specifically, DN helps deactivate muscle trigger points (muscle knots).  o A thin filiform needle is used to penetrate the skin and stimulate the underlying trigger point. The goal is for a local twitch response (LTR) to occur and for the trigger point to relax. No medication of any kind is injected during the procedure.   . What Does Trigger Point Dry Needling Feel Like?  o The procedure feels different for each individual patient. Some patients report that they do not actually feel the needle enter the skin and overall the process is not painful. Very mild bleeding may occur. However, many patients feel a deep cramping in the muscle in which  the needle was inserted. This is the local twitch response.   Marland Kitchen How Will I feel after the treatment? o Soreness is normal, and the onset of soreness may not occur for a few hours. Typically this soreness does not last longer than two days.  o Bruising is uncommon, however; ice can be used to decrease any possible bruising.  o In rare cases feeling tired or nauseous after the treatment is normal. In addition, your symptoms may get worse before they get better, this period will typically not last longer than 24 hours.   . What Can I do After My Treatment? o Increase your hydration by drinking more water for the next 24 hours. o You may place ice or heat on the areas treated that have become sore, however, do not use heat on inflamed or bruised areas. Heat often brings more relief post needling. o You can continue your regular activities, but vigorous activity is not recommended initially after the treatment for 24 hours. o DN is best combined with other physical therapy such as strengthening, stretching, and other therapies.   Garen Lah, PT 03/27/2015 11:45 AM Phone: [phone] Fax: [phone]
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
## 2015-03-30 ENCOUNTER — Ambulatory Visit (INDEPENDENT_AMBULATORY_CARE_PROVIDER_SITE_OTHER): Payer: 59 | Admitting: Pulmonary Disease

## 2015-03-30 ENCOUNTER — Ambulatory Visit (INDEPENDENT_AMBULATORY_CARE_PROVIDER_SITE_OTHER)
Admission: RE | Admit: 2015-03-30 | Discharge: 2015-03-30 | Disposition: A | Discharge status: Home / Self Care | Payer: 59 | Source: Ambulatory Visit | Attending: Pulmonary Disease | Admitting: Pulmonary Disease

## 2015-03-30 ENCOUNTER — Encounter: Payer: Self-pay | Admitting: Pulmonary Disease

## 2015-03-30 VITALS — BP 112/78 | HR 77 | Ht 74.0 in | Wt 192.8 lb

## 2015-03-30 DIAGNOSIS — R059 Cough, unspecified: Secondary | ICD-10-CM

## 2015-03-30 DIAGNOSIS — R05 Cough: Secondary | ICD-10-CM

## 2015-03-30 NOTE — Telephone Encounter (Signed)
Patient should have had PFT scheduled, but it was not set up.  Can you please fix this?  Thanks.

## 2015-03-30 NOTE — Patient Instructions (Signed)
We'll schedule you for chest x-ray and pulmonary function test.  Return to clinic in 1 month to discuss results and any further workup if needed.

## 2015-03-30 NOTE — Progress Notes (Signed)
Subjective:    Patient ID: Donald Simmons, male    DOB: 1975/12/04, 40 y.o.   MRN: [id number]  HPI Evaluation for cough.  Mr. Skoczylas is a 40 year old with past medical history as below including seizure disorder, diabetes. He has a diagnosis of "pleurisy" from several years ago when he was in [place], [place]. At that time he was seen at an urgent care center for cough and was treated with antibiotics. He is not a very good historian and could not give me anymore details of this episode. He hadn't been following up with the doctors for several years because of insurance issues. He is now covered and wants to resume care.  His chief complaint today is cough for several weeks, nonproductive in nature, no sputum, hemoptysis no associated fevers, chills. He denies any sick contacts.  Social History: He is a nonsmoker, no alcohol, illegal drug use. He worked in Therapist, art jobs. He is on disability and unemployed since 2009. He does not have any significant exposures at work or at home.  Family History: Asthma- grandparent.  Past Medical History  Diagnosis Date  . Diabetes (HCC)   . Diabetes mellitus without complication (HCC)   . Hyperlipemia   . Hyperlipemia      Current outpatient prescriptions:  .  docusate sodium (COLACE) 100 MG capsule, Take 100 mg by mouth daily as needed. , Disp: , Rfl:  .  Insulin Pen Needle 31G X 5 MM MISC, Use As Directed with insulin, Disp: 100 each, Rfl: 11 .  lamoTRIgine (LAMICTAL) 25 MG tablet, Take 1 tablet twice a day for 2 weeks, then increase to 2 tablets twice a day for 2 weeks, then increase to 4 tablets twice a day and continue, Disp: 240 tablet, Rfl: 3 .  metFORMIN (GLUCOPHAGE-XR) 500 MG 24 hr tablet, Take two tablets by mouth daily to control blood sugar (Patient taking differently: Take by mouth 2 (two) times daily. ), Disp: 60 tablet, Rfl: 4 .  polyethylene glycol (MIRALAX / GLYCOLAX) packet, Take 17 g by mouth daily as needed. ,  Disp: , Rfl:  .  simvastatin (ZOCOR) 10 MG tablet, Take 1 tablet (10 mg total) by mouth daily. For High Chlosterol, Disp: 30 tablet, Rfl: 4 .  Insulin Glargine (TOUJEO SOLOSTAR) 300 UNIT/ML SOPN, Inject 10 Units into the skin at bedtime. (Patient not taking: Reported on 03/30/2015), Disp: 1.5 mL, Rfl: 3   Review of Systems  Cough, no sputum production, fevers, chills, hemoptysis. No chest pain, palpitation. No nausea, vomiting, diarrhea, consideration. All other review of systems are negative.     Objective:   Physical Exam Blood pressure 112/78, pulse 77, height  (1.88 m), weight 192 lb 12.8 oz (87.454 kg), SpO2 96 %. Gen: No apparent distress Neuro: No gross focal deficits. Neck: No JVD, lymphadenopathy, thyromegaly. RS: No wheeze, crackles. CVS: S1-S2 heard, no murmurs rubs gallops. Abdomen: Soft, positive bowel sounds. Extremities: No edema.    Assessment & Plan:  Assessment for cough.  He has a diagnosis of" pleurisy" from several years ago. He is unable to give any more history regarding this. He returns now with with nonproductive cough for several weeks. Symptoms are likely from a viral resp infection. He does not have any fevers, chills now and will likely not need any antibiotics.  I will assess with a chest x-ray today and pulmonary function tests.  Chilton Greathouse MD [facility] Pager [phone] If no answer or after 3pm  call: [phone] 03/30/2015, 10:52 AM

## 2015-04-02 DIAGNOSIS — Z029 Encounter for administrative examinations, unspecified: Secondary | ICD-10-CM

## 2015-04-06 ENCOUNTER — Ambulatory Visit: Payer: 59 | Admitting: Neurology

## 2015-04-06 ENCOUNTER — Encounter (HOSPITAL_COMMUNITY): Payer: 59

## 2015-04-06 NOTE — Progress Notes (Signed)
Quick Note:  Called spoke with patient, advised of cxr results as stated by PM. Pt verbalized his understanding and denied any questions. ______ 

## 2015-04-09 ENCOUNTER — Ambulatory Visit: Payer: 59 | Attending: Internal Medicine | Admitting: Physical Therapy

## 2015-04-09 DIAGNOSIS — M25512 Pain in left shoulder: Secondary | ICD-10-CM

## 2015-04-09 DIAGNOSIS — M791 Myalgia, unspecified site: Secondary | ICD-10-CM

## 2015-04-09 DIAGNOSIS — M6281 Muscle weakness (generalized): Secondary | ICD-10-CM

## 2015-04-09 DIAGNOSIS — M25612 Stiffness of left shoulder, not elsewhere classified: Secondary | ICD-10-CM

## 2015-04-09 DIAGNOSIS — W19XXXD Unspecified fall, subsequent encounter: Secondary | ICD-10-CM | POA: Diagnosis not present

## 2015-04-09 DIAGNOSIS — M2550 Pain in unspecified joint: Secondary | ICD-10-CM | POA: Diagnosis present

## 2015-04-09 DIAGNOSIS — M7582 Other shoulder lesions, left shoulder: Secondary | ICD-10-CM | POA: Insufficient documentation

## 2015-04-09 DIAGNOSIS — M25562 Pain in left knee: Secondary | ICD-10-CM | POA: Diagnosis present

## 2015-04-09 DIAGNOSIS — R29898 Other symptoms and signs involving the musculoskeletal system: Secondary | ICD-10-CM

## 2015-04-09 DIAGNOSIS — M546 Pain in thoracic spine: Secondary | ICD-10-CM | POA: Diagnosis present

## 2015-04-09 DIAGNOSIS — M25561 Pain in right knee: Secondary | ICD-10-CM

## 2015-04-09 DIAGNOSIS — G8929 Other chronic pain: Secondary | ICD-10-CM

## 2015-04-09 DIAGNOSIS — R6889 Other general symptoms and signs: Secondary | ICD-10-CM | POA: Diagnosis present
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
## 2015-04-09 NOTE — Patient Instructions (Signed)
Hip Extension (Prone)   Lift left leg _6___ inches from floor, keeping knee locked. Repeat __10__ times per set. Do __2__ sets per session. Do _1-2___ sessions per day.  http://orth.exer.us/98   Copyright  VHI. All rights reserved.  Hip Adduction: Leg Lift (Eccentric) - Side-Lying   Lie on side with top leg bent, foot flat behind lower leg. Quickly lift lower leg. Slowly lower for 3-5 seconds. _10__ reps per set, __2_ sets per day, __7_ days per week. Add __2 _ lbs when you achieve _20 __ repetitions.  With good motor control  Copyright  VHI. All rights reserved.  Abduction: Side Leg Lift (Eccentric) - Side-Lying   Lie on side. Lift top leg slightly higher than shoulder level. Keep top leg straight with body, toes pointing forward. Slowly lower for 3-5 seconds. _10__ reps per set, _2__ sets per day, _7__ days per week. Add 2___ lbs when you achieve 20___ repetitions. With good motor control  Copyright  VHI. All rights reserved.  Strengthening: Straight Leg Raise (Phase 1)   Tighten muscles on front of right thigh, then lift leg _6___ inches from surface, keeping knee locked.  Repeat _10___ times per set. Do __2__ sets per session. Do _1-2___ sessions per day.  Levator Stretch   Grasp seat or sit on hand on side to be stretched. Turn head toward other side and look down. Use hand on head to gently stretch neck in that position. Hold _30___ seconds. Repeat on other side. Repeat _2-3___ times. Do __2__ sessions per day.  http://gt2.exer.us/30   Copyright  VHI. All rights reserved.  Side-Bending   One hand on opposite side of head, pull head to side as far as is comfortable. Stop if there is pain. Hold _30___ seconds. Repeat with other hand to other side. Repeat _2-3___ times. Do __1-2__ sessions per day.      .  Trigger Point Dry Needling  . What is Trigger Point Dry Needling (DN)? o DN is a physical therapy technique used to treat muscle pain and dysfunction.  Specifically, DN helps deactivate muscle trigger points (muscle knots).  o A thin filiform needle is used to penetrate the skin and stimulate the underlying trigger point. The goal is for a local twitch response (LTR) to occur and for the trigger point to relax. No medication of any kind is injected during the procedure.   . What Does Trigger Point Dry Needling Feel Like?  o The procedure feels different for each individual patient. Some patients report that they do not actually feel the needle enter the skin and overall the process is not painful. Very mild bleeding may occur. However, many patients feel a deep cramping in the muscle in which the needle was inserted. This is the local twitch response.   Marland Kitchen. How Will I feel after the treatment? o Soreness is normal, and the onset of soreness may not occur for a few hours. Typically this soreness does not last longer than two days.  o Bruising is uncommon, however; ice can be used to decrease any possible bruising.  o In rare cases feeling tired or nauseous after the treatment is normal. In addition, your symptoms may get worse before they get better, this period will typically not last longer than 24 hours.   . What Can I do After My Treatment? o Increase your hydration by drinking more water for the next 24 hours. o You may place ice or heat on the areas treated that have become sore, however,  do not use heat on inflamed or bruised areas. Heat often brings more relief post needling. o You can continue your regular activities, but vigorous activity is not recommended initially after the treatment for 24 hours. o DN is best combined with other physical therapy such as strengthening, stretching, and other therapies.   Garen Lah, PT 04/09/2015 2:25 PM Phone: [phone] Fax: [phone]

## 2015-04-11 ENCOUNTER — Ambulatory Visit: Payer: 59 | Admitting: Neurology

## 2015-04-11 ENCOUNTER — Encounter: Payer: Self-pay | Admitting: Internal Medicine

## 2015-04-11 ENCOUNTER — Ambulatory Visit: Payer: Self-pay | Admitting: Internal Medicine

## 2015-04-11 ENCOUNTER — Ambulatory Visit: Payer: 59 | Admitting: Internal Medicine

## 2015-04-11 DIAGNOSIS — Z029 Encounter for administrative examinations, unspecified: Secondary | ICD-10-CM

## 2015-04-12 ENCOUNTER — Ambulatory Visit: Payer: 59 | Admitting: Physical Therapy

## 2015-04-12 ENCOUNTER — Encounter (HOSPITAL_COMMUNITY): Payer: 59

## 2015-04-17 ENCOUNTER — Institutional Professional Consult (permissible substitution): Payer: 59 | Admitting: Pulmonary Disease

## 2015-04-17 ENCOUNTER — Encounter: Payer: 59 | Admitting: Physical Therapy

## 2015-04-19 ENCOUNTER — Encounter: Payer: Self-pay | Admitting: Neurology

## 2015-04-19 ENCOUNTER — Telehealth: Payer: Self-pay | Admitting: Physical Therapy

## 2015-04-19 ENCOUNTER — Ambulatory Visit (INDEPENDENT_AMBULATORY_CARE_PROVIDER_SITE_OTHER): Payer: 59 | Admitting: Neurology

## 2015-04-19 ENCOUNTER — Ambulatory Visit: Payer: 59 | Admitting: Physical Therapy

## 2015-04-19 VITALS — BP 110/70 | HR 116 | Ht 74.0 in | Wt 191.1 lb

## 2015-04-19 DIAGNOSIS — G40009 Localization-related (focal) (partial) idiopathic epilepsy and epileptic syndromes with seizures of localized onset, not intractable, without status epilepticus: Secondary | ICD-10-CM | POA: Diagnosis not present

## 2015-04-19 MED ORDER — LAMOTRIGINE 100 MG PO TABS: ORAL_TABLET | ORAL | Status: DC
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
## 2015-04-19 NOTE — Telephone Encounter (Signed)
Pt called to check on absence , cancellations.  Pt has had difficulty with transportation and will have issues resolved.  Pt will return to PT on 04-23-15.  Garen LahLawrie Beardsley, PT 04/19/2015 8:29 AM Phone: [phone] Fax: [phone]

## 2015-04-20 ENCOUNTER — Encounter (HOSPITAL_COMMUNITY): Payer: 59

## 2015-04-23 ENCOUNTER — Ambulatory Visit: Payer: 59 | Admitting: Physical Therapy

## 2015-04-23 ENCOUNTER — Encounter: Payer: 59 | Admitting: Physical Therapy

## 2015-04-23 DIAGNOSIS — M25612 Stiffness of left shoulder, not elsewhere classified: Secondary | ICD-10-CM

## 2015-04-23 DIAGNOSIS — G8929 Other chronic pain: Secondary | ICD-10-CM

## 2015-04-23 DIAGNOSIS — M25512 Pain in left shoulder: Secondary | ICD-10-CM

## 2015-04-23 DIAGNOSIS — W19XXXD Unspecified fall, subsequent encounter: Secondary | ICD-10-CM

## 2015-04-23 DIAGNOSIS — R6889 Other general symptoms and signs: Secondary | ICD-10-CM

## 2015-04-23 DIAGNOSIS — M25561 Pain in right knee: Secondary | ICD-10-CM

## 2015-04-23 DIAGNOSIS — M2550 Pain in unspecified joint: Secondary | ICD-10-CM | POA: Diagnosis not present

## 2015-04-23 DIAGNOSIS — M546 Pain in thoracic spine: Secondary | ICD-10-CM

## 2015-04-23 DIAGNOSIS — M25562 Pain in left knee: Secondary | ICD-10-CM

## 2015-04-23 DIAGNOSIS — M791 Myalgia, unspecified site: Secondary | ICD-10-CM

## 2015-04-23 DIAGNOSIS — M6281 Muscle weakness (generalized): Secondary | ICD-10-CM

## 2015-04-23 DIAGNOSIS — R29898 Other symptoms and signs involving the musculoskeletal system: Secondary | ICD-10-CM

## 2015-04-23 NOTE — Patient Instructions (Addendum)
Over Head Pull: Narrow Grip       On back, knees bent, feet flat, band across thighs, elbows straight but relaxed. Pull hands apart (start). Keeping elbows straight, bring arms up and over head, hands toward floor. Keep pull steady on band. Hold momentarily. Return slowly, keeping pull steady, back to start. Repeat 10___ times. Band color _red_____   Side Pull: Double Arm   On back, knees bent, feet flat. Arms perpendicular to body, shoulder level, elbows straight but relaxed. Pull arms out to sides, elbows straight. Resistance band comes across collarbones, hands toward floor. Hold momentarily. Slowly return to starting position. Repeat 10___ times. Band color _red____   Donald Simmons   On back, knees bent, feet flat, left hand on left hip, right hand above left. Pull right arm DIAGONALLY (hip to shoulder) across chest. Bring right arm along head toward floor. Hold momentarily. Slowly return to starting position. Repeat _10__ times. Do with left arm. Band color _red_____   Shoulder Rotation: Double Arm   On back, knees bent, feet flat, elbows tucked at sides, bent 90, hands palms up. Pull hands apart and down toward floor, keeping elbows near sides. Hold momentarily. Slowly return to starting position. Repeat _10__ times. Band color __red____   Fall Prevention and Home Safety Falls cause injuries and can affect all age groups. It is possible to use preventive measures to significantly decrease the likelihood of falls. There are many simple measures which can make your home safer and prevent falls. OUTDOORS  Repair cracks and edges of walkways and driveways.  Remove high doorway thresholds.  Trim shrubbery on the main path into your home.  Have good outside lighting.  Clear walkways of tools, rocks, debris, and clutter.  Check that handrails are not broken and are securely fastened. Both sides of steps should have handrails.  Have leaves, snow, and ice cleared regularly.  Use sand or  salt on walkways during winter months.  In the garage, clean up grease or oil spills. BATHROOM  Install night lights.  Install grab bars by the toilet and in the tub and shower.  Use non-skid mats or decals in the tub or shower.  Place a plastic non-slip stool in the shower to sit on, if needed.  Keep floors dry and clean up all water on the floor immediately.  Remove soap buildup in the tub or shower on a regular basis.  Secure bath mats with non-slip, double-sided rug tape.  Remove throw rugs and tripping hazards from the floors. BEDROOMS  Install night lights.  Make sure a bedside light is easy to reach.  Do not use oversized bedding.  Keep a telephone by your bedside.  Have a firm chair with side arms to use for getting dressed.  Remove throw rugs and tripping hazards from the floor. KITCHEN  Keep handles on pots and pans turned toward the center of the stove. Use back burners when possible.  Clean up spills quickly and allow time for drying.  Avoid walking on wet floors.  Avoid hot utensils and knives.  Position shelves so they are not too high or low.  Place commonly used objects within easy reach.  If necessary, use a sturdy step stool with a grab bar when reaching.  Keep electrical cables out of the way.  Do not use floor polish or wax that makes floors slippery. If you must use wax, use non-skid floor wax.  Remove throw rugs and tripping hazards from the floor. STAIRWAYS  Never leave  objects on stairs.  Place handrails on both sides of stairways and use them. Fix any loose handrails. Make sure handrails on both sides of the stairways are as long as the stairs.  Check carpeting to make sure it is firmly attached along stairs. Make repairs to worn or loose carpet promptly.  Avoid placing throw rugs at the top or bottom of stairways, or properly secure the rug with carpet tape to prevent slippage. Get rid of throw rugs, if possible.  Have an  electrician put in a light switch at the top and bottom of the stairs. OTHER FALL PREVENTION TIPS  Wear low-heel or rubber-soled shoes that are supportive and fit well. Wear closed toe shoes.  When using a stepladder, make sure it is fully opened and both spreaders are firmly locked. Do not climb a closed stepladder.  Add color or contrast paint or tape to grab bars and handrails in your home. Place contrasting color strips on first and last steps.  Learn and use mobility aids as needed. Install an electrical emergency response system.  Turn on lights to avoid dark areas. Replace light bulbs that burn out immediately. Get light switches that glow.  Arrange furniture to create clear pathways. Keep furniture in the same place.  Firmly attach carpet with non-skid or double-sided tape.  Eliminate uneven floor surfaces.  Select a carpet pattern that does not visually hide the edge of steps.  Be aware of all pets. OTHER HOME SAFETY TIPS  Set the water temperature for 120 F (48.8 C).  Keep emergency numbers on or near the telephone.  Keep smoke detectors on every level of the home and near sleeping areas. Document Released: 01/10/2002 Document Revised: 07/22/2011 Document Reviewed: 04/11/2011 [facility] Patient Information 2015 [employer]. This information is not intended to replace advice given to you by your health care provider. Make sure you discuss any questions you have with your health care provider.   Donald LahLawrie Krina Simmons, PT 04/23/2015 12:48 PM Phone: [phone] Fax: [phone]
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
## 2015-04-24 ENCOUNTER — Ambulatory Visit: Payer: 59 | Admitting: Physical Therapy

## 2015-04-25 ENCOUNTER — Encounter: Payer: 59 | Admitting: Physical Therapy

## 2015-04-26 ENCOUNTER — Ambulatory Visit: Payer: 59 | Admitting: Physical Therapy

## 2015-04-30 ENCOUNTER — Ambulatory Visit: Payer: 59 | Admitting: Physical Therapy

## 2015-04-30 ENCOUNTER — Ambulatory Visit (HOSPITAL_COMMUNITY): Admission: RE | Admit: 2015-04-30 | Payer: 59 | Source: Ambulatory Visit

## 2015-05-01 ENCOUNTER — Ambulatory Visit: Payer: 59 | Admitting: Pulmonary Disease

## 2015-05-02 ENCOUNTER — Ambulatory Visit: Payer: 59 | Admitting: Physical Therapy

## 2015-05-10 ENCOUNTER — Ambulatory Visit: Payer: 59

## 2015-05-20 ENCOUNTER — Telehealth: Payer: Self-pay | Admitting: Neurology

## 2015-05-20 NOTE — Telephone Encounter (Signed)
Records from his previous neurologist [place] were [place]:  EEG 12/31/12 showed mild diffuse slowing and generalized intermittent slowing Prior AEDs: Tegretol, Depakote

## 2015-05-30 ENCOUNTER — Encounter: Payer: Self-pay | Admitting: Internal Medicine

## 2015-05-30 ENCOUNTER — Ambulatory Visit (INDEPENDENT_AMBULATORY_CARE_PROVIDER_SITE_OTHER): Payer: 59 | Admitting: Internal Medicine

## 2015-05-30 VITALS — BP 110/68 | HR 114 | Temp 97.9°F | Resp 20 | Ht 74.0 in | Wt 189.4 lb

## 2015-05-30 DIAGNOSIS — G629 Polyneuropathy, unspecified: Secondary | ICD-10-CM

## 2015-05-30 DIAGNOSIS — E1140 Type 2 diabetes mellitus with diabetic neuropathy, unspecified: Secondary | ICD-10-CM | POA: Diagnosis not present

## 2015-05-30 DIAGNOSIS — Z794 Long term (current) use of insulin: Secondary | ICD-10-CM

## 2015-05-30 DIAGNOSIS — M2550 Pain in unspecified joint: Secondary | ICD-10-CM | POA: Diagnosis not present

## 2015-05-30 DIAGNOSIS — E785 Hyperlipidemia, unspecified: Secondary | ICD-10-CM

## 2015-05-30 DIAGNOSIS — R569 Unspecified convulsions: Secondary | ICD-10-CM | POA: Diagnosis not present
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
## 2015-05-30 NOTE — Patient Instructions (Addendum)
Please schedule appt with Donald Simmons for insulin teaching  START Toujeo 10 units at bedtime  Follow up in 2-3 months for routine visit  Return to office for fasting labs

## 2015-06-01 ENCOUNTER — Other Ambulatory Visit: Payer: 59

## 2015-06-04 ENCOUNTER — Ambulatory Visit: Payer: 59 | Admitting: Pharmacotherapy

## 2015-06-04 ENCOUNTER — Encounter: Payer: Self-pay | Admitting: Internal Medicine

## 2015-06-04 ENCOUNTER — Telehealth: Payer: Self-pay | Admitting: *Deleted

## 2015-06-04 NOTE — Telephone Encounter (Signed)
Patient had left SCAT Form to be filled out at his appointment on 05/30/15. Dr. Montez Moritaarter gave forms back stating that it is missing page 3 for physician signature.  I called patient and LMOM that we would need page 3 in order to complete the paperwork.

## 2015-06-07 NOTE — Telephone Encounter (Signed)
Called and LMOM that we needed page 3 of the SCAT form in order to complete the forms. Asked for them to drop it off or return call.

## 2015-06-07 NOTE — Telephone Encounter (Signed)
Received page 3 of SCAT Form and given to Dr. Montez Moritaarter to review and sign.

## 2015-06-08 ENCOUNTER — Telehealth: Payer: Self-pay | Admitting: Internal Medicine

## 2015-06-08 NOTE — Telephone Encounter (Signed)
A form was dropped off by Lowella Fairyhristopher Toelle to be filled out by [employer] for [employer] [facility].

## 2015-06-08 NOTE — Telephone Encounter (Signed)
[place] from [facility] [employer] received fro [employer] due to patient having seizures and blacks out. Also has chronic pain and due to this is unable to care for toddler.  Form placed in Dr. Celene Skeenarter's folder for review and signature. To call Bobbie StackGia Terrell (spouse) was completed #[phone] or #[phone]

## 2015-06-11 NOTE — Telephone Encounter (Signed)
SCAT Forms filled out and signed. I called and LMOM for Gia to pick up due to Applicant signatures needed on form. Left up front for pick up.

## 2015-06-11 NOTE — Telephone Encounter (Signed)
Form copied and left original up front for pick up. Bobbie StackGia Terrell notified.

## 2015-06-20 ENCOUNTER — Encounter: Payer: 59 | Admitting: Neurology

## 2015-06-20 ENCOUNTER — Telehealth: Payer: Self-pay | Admitting: Neurology

## 2015-06-20 ENCOUNTER — Telehealth: Payer: Self-pay | Admitting: *Deleted

## 2015-06-20 NOTE — Telephone Encounter (Signed)
Message For: OFC                  Taken 17-MAY-17 at  8:41AM by JPO ------------------------------------------------------------ [name]          CID [id number]  Patient SAME                 Pt's Dr UNSURE       Area Code 914 Phone# [phone] * DOB [phone]     RE CANCEL APPT 5/17 @ 12:30/PCB TO RESCHEDULE                                                             Disp:Y/N N If Y = C/B If No Response In 20minutes ============================================================  Lt pt voicemail to call to r/s appt.

## 2015-06-20 NOTE — Telephone Encounter (Signed)
This patient did not show for an EMG and nerve conduction study appointment today. 

## 2015-06-21 ENCOUNTER — Encounter: Payer: Self-pay | Admitting: Neurology

## 2015-08-17 ENCOUNTER — Other Ambulatory Visit: Payer: Self-pay | Admitting: Internal Medicine

## 2015-09-24 ENCOUNTER — Ambulatory Visit: Payer: 59 | Admitting: Neurology

## 2015-10-09 ENCOUNTER — Other Ambulatory Visit: Payer: Self-pay

## 2015-10-09 DIAGNOSIS — G40009 Localization-related (focal) (partial) idiopathic epilepsy and epileptic syndromes with seizures of localized onset, not intractable, without status epilepticus: Secondary | ICD-10-CM

## 2015-10-09 MED ORDER — LAMOTRIGINE 100 MG PO TABS: ORAL_TABLET | ORAL | 2 refills | Status: DC

## 2015-10-09 NOTE — Telephone Encounter (Signed)
Received fax from [facility] requesting 90 day supply of Lamictal 100mg  to be sent to them. Confirmed with pt for rx to be sent there.

## 2015-10-16 ENCOUNTER — Telehealth: Payer: Self-pay | Admitting: Internal Medicine

## 2015-10-16 NOTE — Telephone Encounter (Signed)
Wife States that the form needs more details in Part B stating his diagnosis of Arthritis, Neuropathy and Seizures per [facility]. Form placed in Dr. Celene Skeenarter's folder to review. Printed last OV and Face Sheet

## 2015-10-19 ENCOUNTER — Telehealth: Payer: Self-pay | Admitting: Internal Medicine

## 2015-10-19 NOTE — Telephone Encounter (Signed)
Received FMLA paperwork for Bobbie StackGia Terrell, wife. Needing due to at intermittent times she needs to step away form job to bring her husband out of seizures and monitor him for an unspecified amount of time depending on his breathing, his ability to move and do for himself. He Kalika Smay need assistance with bathroom, food and daily living.  Needs to be returned to [facility] [employer] [facility] [employer] [facility] Fax: [phone] Case #: [id number] Employee ID: [id number] [place] # [phone] Fax:[phone] Printed last OV note and face Sheet and attached.

## 2015-10-29 NOTE — Telephone Encounter (Signed)
Patient notified that paperwork was ready for pick up. Sent copy for scanning

## 2015-11-06 NOTE — Telephone Encounter (Addendum)
Donald Simmons, Wife called and stated that there was [facility]. Question 1-How long condition or started treating patient. Question 5-Frequency and duration of appointment--How long for the appointment and how long to get to the appointment. (wife says 3 hours every 2 months) Question 7-Frequency and duration of patient's illness and flareup--put more time than needed such as 2 days per week. 8 hours per day, 2 days per week, 3 times a month.  Last page was missing of doctors signature.  Need to sign and date each correction.  Paperwork placed for Dr. Montez Moritaarter to correct.

## 2015-11-12 DIAGNOSIS — Z029 Encounter for administrative examinations, unspecified: Secondary | ICD-10-CM

## 2015-12-04 ENCOUNTER — Ambulatory Visit: Payer: 59 | Admitting: Neurology

## 2015-12-14 ENCOUNTER — Encounter: Payer: Self-pay | Admitting: Neurology

## 2016-02-19 ENCOUNTER — Encounter: Payer: Self-pay | Admitting: Neurology

## 2016-02-19 ENCOUNTER — Ambulatory Visit (INDEPENDENT_AMBULATORY_CARE_PROVIDER_SITE_OTHER): Payer: Medicaid Other | Admitting: Neurology

## 2016-02-19 VITALS — BP 122/70 | HR 95 | Ht 74.0 in | Wt 185.2 lb

## 2016-02-19 DIAGNOSIS — G40009 Localization-related (focal) (partial) idiopathic epilepsy and epileptic syndromes with seizures of localized onset, not intractable, without status epilepticus: Secondary | ICD-10-CM | POA: Diagnosis not present

## 2016-02-19 MED ORDER — LAMOTRIGINE 100 MG PO TABS: ORAL_TABLET | ORAL | 11 refills | Status: DC
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
## 2016-02-27 ENCOUNTER — Encounter: Payer: Self-pay | Admitting: Neurology

## 2016-03-28 ENCOUNTER — Telehealth: Payer: Self-pay

## 2016-03-28 NOTE — Telephone Encounter (Signed)
Left message on voicemail for patient to return call when available, reason for call:  Last OV was 05/30/15, patient needs to schedule fasting lab appointment and an appointment with Dr.Carter 2-3 days later

## 2016-04-04 NOTE — Telephone Encounter (Signed)
Left message for patient to return call to office to schedule an appt to see Shanda BumpsJessica or Dr. Montez Moritaarter to continue getting medication refills.

## 2016-04-09 NOTE — Telephone Encounter (Signed)
Left message for patient to return call to schedule appt.Letter mailed due to unsuccessful contact by phone.

## 2016-05-02 ENCOUNTER — Telehealth: Payer: Self-pay | Admitting: Neurology

## 2016-05-02 NOTE — Telephone Encounter (Signed)
Received call via answering service.  Pt stated that he never started lamictal due to "insurance reasons" (although [facility]) and would like it sent in now because insurance issue resolved.  I sent in only 50 mg q hs x 2 weeks to [facility] via answering service but told him if he needed to change [facility] then needed to call office at normal business hours as 1 year RX sent there in Jan.

## 2016-05-05 NOTE — Telephone Encounter (Signed)
Clld pt - Pt advsd he already picked up his medication.   Clld pt  - LMOVMTC with pharmacy info to send in new Rx.

## 2016-05-05 NOTE — Telephone Encounter (Signed)
Patient called over the weekend about his medication. Can you pls call patient and ask which pharmacy to send Rx to? Pls send Rx Lamotrigine : Take 1/2 tablet at night for 2 weeks, then increase to 1/2 tablet twice a day for 2 weeks, then increase to 1 tablet twice a day and continue. Dispense #[phone] with 6 refills, thanks

## 2016-06-09 ENCOUNTER — Ambulatory Visit (INDEPENDENT_AMBULATORY_CARE_PROVIDER_SITE_OTHER): Payer: Medicaid Other | Admitting: Neurology

## 2016-06-09 ENCOUNTER — Encounter: Payer: Self-pay | Admitting: Neurology

## 2016-06-09 VITALS — BP 112/58 | HR 82 | Temp 98.4°F | Ht 75.0 in | Wt 185.0 lb

## 2016-06-09 DIAGNOSIS — G40009 Localization-related (focal) (partial) idiopathic epilepsy and epileptic syndromes with seizures of localized onset, not intractable, without status epilepticus: Secondary | ICD-10-CM | POA: Diagnosis not present

## 2016-06-09 DIAGNOSIS — G629 Polyneuropathy, unspecified: Secondary | ICD-10-CM | POA: Diagnosis not present

## 2016-06-09 MED ORDER — LAMOTRIGINE 100 MG PO TABS: ORAL_TABLET | ORAL | 3 refills | Status: DC
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
## 2016-06-26 ENCOUNTER — Encounter: Payer: Medicaid Other | Admitting: Neurology

## 2016-07-10 ENCOUNTER — Encounter: Payer: Medicaid Other | Admitting: Neurology

## 2016-09-10 ENCOUNTER — Ambulatory Visit: Payer: Medicaid Other | Admitting: Neurology

## 2016-10-13 ENCOUNTER — Ambulatory Visit: Payer: Self-pay | Admitting: Neurology

## 2016-10-29 ENCOUNTER — Other Ambulatory Visit: Payer: Self-pay | Admitting: Internal Medicine

## 2016-11-12 ENCOUNTER — Encounter: Payer: Self-pay | Admitting: Neurology

## 2016-11-12 ENCOUNTER — Ambulatory Visit (INDEPENDENT_AMBULATORY_CARE_PROVIDER_SITE_OTHER): Payer: Medicaid Other | Admitting: Neurology

## 2016-11-12 VITALS — BP 110/74 | HR 89 | Ht 74.0 in | Wt 187.0 lb

## 2016-11-12 DIAGNOSIS — G40009 Localization-related (focal) (partial) idiopathic epilepsy and epileptic syndromes with seizures of localized onset, not intractable, without status epilepticus: Secondary | ICD-10-CM | POA: Diagnosis not present

## 2016-11-12 MED ORDER — LAMOTRIGINE 100 MG PO TABS: ORAL_TABLET | ORAL | 11 refills | Status: DC
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
## 2017-01-04 ENCOUNTER — Other Ambulatory Visit: Payer: Self-pay | Admitting: Internal Medicine

## 2017-02-16 ENCOUNTER — Ambulatory Visit: Payer: Medicaid Other | Admitting: Neurology

## 2017-03-09 DIAGNOSIS — Z029 Encounter for administrative examinations, unspecified: Secondary | ICD-10-CM

## 2017-04-10 ENCOUNTER — Other Ambulatory Visit: Payer: Self-pay | Admitting: *Deleted

## 2017-04-10 ENCOUNTER — Telehealth: Payer: Self-pay | Admitting: Neurology

## 2017-04-10 DIAGNOSIS — G40009 Localization-related (focal) (partial) idiopathic epilepsy and epileptic syndromes with seizures of localized onset, not intractable, without status epilepticus: Secondary | ICD-10-CM

## 2017-04-10 MED ORDER — LAMOTRIGINE 100 MG PO TABS: ORAL_TABLET | ORAL | 11 refills | Status: DC

## 2017-04-10 NOTE — Telephone Encounter (Signed)
Rx sent 

## 2017-04-10 NOTE — Telephone Encounter (Signed)
° °  1.     Which medications need to be refilled? (please list name of each medication and dose if know) lamotrigine   2.     Which pharmacy/location (including street and city if local pharmacy) is medication to be sent to? [facility] [address]   3.     Do they need a 30 or 90 day supply? 30

## 2017-04-16 ENCOUNTER — Telehealth: Payer: Self-pay | Admitting: Neurology

## 2017-04-16 NOTE — Telephone Encounter (Signed)
Pt left a VM message wanting to know if FMLA paperwork was received and filled out and faxed, please advise

## 2017-04-17 NOTE — Telephone Encounter (Signed)
Dr. Karel JarvisAquino - I do not have any paperwork for this pt.  Do you?

## 2017-04-22 ENCOUNTER — Encounter: Payer: Self-pay | Admitting: Neurology

## 2017-04-22 ENCOUNTER — Ambulatory Visit: Payer: Medicaid Other | Admitting: Neurology

## 2017-04-22 VITALS — BP 106/68 | HR 72 | Resp 16 | Ht 74.0 in | Wt 191.4 lb

## 2017-04-22 DIAGNOSIS — F32A Depression, unspecified: Secondary | ICD-10-CM

## 2017-04-22 DIAGNOSIS — G40009 Localization-related (focal) (partial) idiopathic epilepsy and epileptic syndromes with seizures of localized onset, not intractable, without status epilepticus: Secondary | ICD-10-CM

## 2017-04-22 DIAGNOSIS — F329 Major depressive disorder, single episode, unspecified: Secondary | ICD-10-CM | POA: Diagnosis not present

## 2017-04-22 MED ORDER — NORTRIPTYLINE HCL 10 MG PO CAPS
10.0000 mg | ORAL_CAPSULE | Freq: Every day | ORAL | 6 refills | Status: DC
Start: 2017-04-22 — End: 2017-07-23
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
## 2017-04-22 NOTE — Telephone Encounter (Signed)
appt on 04/22/17
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
## 2017-05-18 ENCOUNTER — Ambulatory Visit: Payer: Medicaid Other | Admitting: Neurology

## 2017-06-17 ENCOUNTER — Encounter: Payer: Self-pay | Admitting: Endocrinology

## 2017-06-17 ENCOUNTER — Ambulatory Visit: Payer: Medicaid Other | Admitting: Endocrinology

## 2017-06-17 ENCOUNTER — Encounter: Payer: Medicaid Other | Attending: Endocrinology | Admitting: Nutrition

## 2017-06-17 VITALS — BP 134/90 | HR 96 | Ht 74.0 in | Wt 192.6 lb

## 2017-06-17 DIAGNOSIS — E1165 Type 2 diabetes mellitus with hyperglycemia: Secondary | ICD-10-CM

## 2017-06-17 DIAGNOSIS — E118 Type 2 diabetes mellitus with unspecified complications: Secondary | ICD-10-CM

## 2017-06-17 DIAGNOSIS — Z713 Dietary counseling and surveillance: Secondary | ICD-10-CM | POA: Diagnosis present

## 2017-06-17 LAB — COMPREHENSIVE METABOLIC PANEL
ALT: 17 U/L (ref 0–53)
AST: 14 U/L (ref 0–37)
Albumin: 4.3 g/dL (ref 3.5–5.2)
Alkaline Phosphatase: 75 U/L (ref 39–117)
BUN: 10 mg/dL (ref 6–23)
CHLORIDE: 98 meq/L (ref 96–112)
CO2: 31 meq/L (ref 19–32)
Calcium: 9.2 mg/dL (ref 8.4–10.5)
Creatinine, Ser: 0.79 mg/dL (ref 0.40–1.50)
GFR: 139.71 mL/min (ref >60.00)
GLUCOSE: 253 mg/dL — AB (ref 70–99)
POTASSIUM: 3.9 meq/L (ref 3.5–5.1)
SODIUM: 134 meq/L — AB (ref 135–145)
Total Bilirubin: 0.3 mg/dL (ref 0.2–1.2)
Total Protein: 7.8 g/dL (ref 6.0–8.3)

## 2017-06-17 LAB — GLUCOSE, POCT (MANUAL RESULT ENTRY): POC GLUCOSE: 292 mg/dL — AB (ref 70–99)

## 2017-06-17 LAB — POCT GLYCOSYLATED HEMOGLOBIN (HGB A1C): Hemoglobin A1C: 11.3

## 2017-06-17 MED ORDER — GLUCOSE BLOOD VI STRP: ORAL_STRIP | 12 refills | Status: DC

## 2017-06-17 MED ORDER — INSULIN ISOPHANE & REGULAR (HUMAN 70-30)100 UNIT/ML KWIKPEN: PEN_INJECTOR | SUBCUTANEOUS | 0 refills | Status: DC

## 2017-06-17 MED ORDER — ACCU-CHEK AVIVA DEVI: 0 refills | Status: AC

## 2017-06-17 MED ORDER — INSULIN PEN NEEDLE 31G X 5 MM MISC: 1 refills | Status: DC

## 2017-06-17 NOTE — Progress Notes (Signed)
Patient ID: Donald Simmons, male   DOB: Oct 07, 1975, 42 y.o.   MRN: [id number]          Reason for Appointment: Consultation for  Diabetes  Referring physician: Karel Jarvis   History of Present Illness:          Date of diagnosis of type 2 diabetes mellitus: 2013       Background history:   At the time of diagnosis his blood sugar was 350 and he had symptoms of high sugar He was started on metformin and continued on this He thinks that metformin would tend to cause occasional diarrhea and he does better with the extended release No detailed records are available but he has been irregular with his management because of affordability and insurance issues   Recent history:       His A1c today is 11.3, no recent comparison available  Non-insulin hypoglycemic drugs the patient is taking are: Metformin ER 500 mg twice daily  Current management, blood sugar patterns and problems identified:  He has had no significant care for his diabetes for couple of years  Has not checked his blood sugar in several months and it does not have a meter  Although he does not complain of excessive thirst or urination his blood sugars appear to be significantly high   In 2017 he was recommended insulin but never started on this        Side effects from medications have been:?  Constipation and diarrhea from metformin  Compliance with the medical regimen: Poor Hypoglycemia: Never   Glucose monitoring:  Not done           Glucometer: none     Self-care: The diet that the patient has been following is: tries to limit meats.       Typical meal intake: Breakfast is fruit smoothies, some meals may be fast food               Dietician visit, most recent: Never               Exercise: none   Weight history: 180-190  Wt Readings from Last 3 Encounters:  06/17/17 192 lb 9.6 oz (87.4 kg)  04/22/17 191 lb 6.4 oz (86.8 kg)  11/12/16 187 lb (84.8 kg)    Glycemic control:   Lab Results    Component Value Date   HGBA1C 11.3 06/17/2017   HGBA1C 9.9 (H) 02/07/2015   HGBA1C 8.6 (H) 10/25/2014   Lab Results  Component Value Date   LDLCALC 93 02/07/2015   CREATININE 0.68 (L) 02/07/2015   Lab Results  Component Value Date   MICRALBCREAT 4.5 10/25/2014    No results found for: FRUCTOSAMINE    Allergies as of 06/17/2017      Reactions   Asa [aspirin]    Asa [aspirin] Hives      Medication List        Accurate as of 06/17/17  4:10 PM. Always use your most recent med list.          docusate sodium 100 MG capsule Commonly known as:  COLACE Take 100 mg by mouth daily as needed.   Insulin Isophane & Regular Human (70-30) 100 UNIT/ML PEN Commonly known as:  HUMULIN 70/30 K[place]PEN 14 units at breakfast and 10 units before dinner, increase as directed   Insulin Pen Needle 31G X 5 MM Misc Use with insulin pen twice a day   lamoTRIgine 100 MG tablet Commonly known as:  LAMICTAL Take  2 tablets twice a day   metFORMIN 500 MG 24 hr tablet Commonly known as:  GLUCOPHAGE-XR TAKE TWO TABLETS BY MOUTH ONCE DAILY TO  CONTROL  BLOOD  SUGAR   nortriptyline 10 MG capsule Commonly known as:  PAMELOR Take 1 capsule (10 mg total) by mouth at bedtime.   simvastatin 10 MG tablet Commonly known as:  ZOCOR Take 1 tablet (10 mg total) by mouth daily. For High Chlosterol       Allergies:  Allergies  Allergen Reactions  . Asa [Aspirin]   . Asa [Aspirin] Hives    Past Medical History:  Diagnosis Date  . Diabetes (HCC)   . Diabetes mellitus without complication (HCC)   . Hyperlipemia   . Hyperlipemia     History reviewed. No pertinent surgical history.  Family History  Problem Relation Age of Onset  . Diabetes Mother   . Thyroid disease Mother   . Hyperlipidemia Mother   . Seizures Maternal Uncle   . Diabetes Maternal Uncle     Social History:  reports that he has never smoked. He has never used smokeless tobacco. He reports that he drinks alcohol. He  reports that he does not use drugs.   Review of Systems  Constitutional: Negative for weight loss.  HENT: Negative for headaches.   Eyes: Negative for blurred vision.  Respiratory: Negative for shortness of breath.   Cardiovascular: Negative for leg swelling.  Gastrointestinal: Positive for constipation.  Endocrine: Positive for fatigue. Negative for polydipsia.  Genitourinary: Positive for nocturia.       3x  Musculoskeletal: Positive for joint pain.  Skin: Negative for rash.  Neurological: Positive for numbness and tingling.     Lipid history: He has been started on simvastatin a couple of years ago, not clear if he is taking this regularly are not and no recent labs available    Lab Results  Component Value Date   CHOL 202 (H) 02/07/2015   HDL 33 (L) 02/07/2015   LDLCALC 93 02/07/2015   TRIG 380 (H) 02/07/2015   CHOLHDL 6.1 (H) 02/07/2015           Hypertension: Not present  BP Readings from Last 3 Encounters:  06/17/17 134/90  04/22/17 106/68  11/12/16 110/74    Most recent eye exam was years ago  Most recent foot exam: 5/19  Pains in his lower legs present, some tingling and burning night  Counseling time on subjects discussed in assessment and plan sections is over 50% of today's 25 minute visit   Physical Examination:  BP 134/90 (BP Location: Right Arm, Patient Position: Sitting, Cuff Size: Normal)   Pulse 96   Ht  (1.88 m)   Wt 192 lb 9.6 oz (87.4 kg)   SpO2 96%   BMI 24.73 kg/m   GENERAL:        He is averagely built and nourished     HEENT:         Eye exam shows normal external appearance.  Fundus exam shows no retinopathy.  Oral exam shows normal mucosa .  NECK:   There is no lymphadenopathy Thyroid is not enlarged and no nodules felt.  Carotids are normal to palpation and no bruit heard  LUNGS:         Chest is symmetrical. Lungs are clear to auscultation.Marland Kitchen   HEART:         Heart sounds:  S1 and S2 are normal. No murmur or click  heard., no S3 or S4.  ABDOMEN:   There is no distention present. Liver and spleen are not palpable. No other mass or tenderness present.    NEUROLOGICAL:   Ankle jerks are absent bilaterally.    Diabetic Foot Exam - Simple   Simple Foot Form Diabetic Foot exam was performed with the following findings:  Yes   Visual Inspection Sensation Testing See comments:  Yes Pulse Check Posterior Tibialis and Dorsalis pulse intact bilaterally:  Yes Comments Mild decrease in monofilament sensation throughout the distal toes            Vibration sense is mildly reduced in distal first toes. MUSCULOSKELETAL:  There is no swelling or deformity of the peripheral joints.     EXTREMITIES:     There is no edema.  SKIN:       No rash or lesions of concern.        ASSESSMENT:  Diabetes type 2, uncontrolled, nonobese  Patient has been treated only with metformin for the last 6 years with what appears to be persistently poor control His blood sugars are escalating now with A1c going up to 11.3 He has had no diabetes education No recent glucose monitoring Surprisingly is not symptomatic with his hyperglycemia currently  Discussed with the patient that he is having progression of his diabetes and likely insulin deficient Especially with his not being obese he is unlikely to respond to non-insulin hypoglycemic drugs at this point Also for treatment of glucose toxicity he needs to start on insulin May or may not benefit from metformin  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  History of hypercholesterolemia and hypertriglyceridemia, will need to have follow-up with fasting labs once his blood sugars are better controlled   PLAN:    For simplicity we will start him on premixed insulin twice a day  He will use Humulin 70/30 which may be better covered by his Medicaid  To start with he can take 14 units before breakfast and 10 units before suppertime, 30 minutes  before eating  Discussed the nature of premixed insulin and the timing of the insulin as well as the actions  Also discussed possibility of hypoglycemia but since he is starting a relatively small dose for his level of blood sugars he should not have any issues in the near term  Given detailed instructions on adjusting the dose by 2 units every 3 to 4 days based on either fasting or suppertime readings  Will discuss adjusting or changing his insulin type on the next visit based on his blood sugar patterns  He will start using Accu-Chek monitor and need to check sugars at least 3 times a day including some after meals and also before breakfast and suppertime  He will keep his appointment to see the dietitian later this month for meal planning  More consistent diet which will be balanced and reduce fat intake, instructions given  He needs to stop drinking fruit smoothies in the morning  NEUROPATHY: He can increase his nortriptyline to 2 mg capsules for now and discuss further treatment with his neurologist on follow-up   Patient Instructions  Check blood sugars on waking up and before suppertime daily  Also check blood sugars about 2 hours after a meal about once a day and do this after different meals by rotation  Recommended blood sugar levels on waking up is 90-130 and about 2 hours after meal is 130-160  Please bring your blood sugar monitor to each visit, thank you  INSULIN:  This needs to be taken 15-30 minutes before breakfast and suppertime daily  Start with 14 units before breakfast and 10 units before dinner  Every 4 to 5 days adjust the doses of the insulin as follows:  If the blood sugar at dinnertime  is consistently over 150 increase the morning dose by 2 units  If the blood sugar on waking up is consistently over 150 increase the evening dose by 2 units  Avoid a lot of fruits and have more of protein smoothie in the morning Avoid fast food biscuits, fried food,  cream sauces, fatty meats and any  drink with sugar  Increase nortriptyline to 2 tablets    Consultation note has been sent to the referring physician  Counseling time on subjects discussed in assessment and plan sections is over 50% of today' 60 minute visit   Reather Littler 06/17/2017, 4:10 PM   Note: This office note was prepared with Dragon voice recognition system technology. Any transcriptional errors that result from this process are unintentional.

## 2017-06-17 NOTE — Patient Instructions (Addendum)
Check blood sugars on waking up and before suppertime daily  Also check blood sugars about 2 hours after a meal about once a day and do this after different meals by rotation  Recommended blood sugar levels on waking up is 90-130 and about 2 hours after meal is 130-160  Please bring your blood sugar monitor to each visit, thank you  INSULIN: This needs to be taken 15-30 minutes before breakfast and suppertime daily  Start with 14 units before breakfast and 10 units before dinner  Every 4 to 5 days adjust the doses of the insulin as follows:  If the blood sugar at dinnertime  is consistently over 150 increase the morning dose by 2 units  If the blood sugar on waking up is consistently over 150 increase the evening dose by 2 units  Avoid a lot of fruits and have more of protein smoothie in the morning Avoid fast food biscuits, fried food, cream sauces, fatty meats and any  drink with sugar  Increase nortriptyline to 2 tablets

## 2017-06-17 NOTE — Progress Notes (Signed)
poct

## 2017-06-18 ENCOUNTER — Telehealth: Payer: Self-pay

## 2017-06-18 NOTE — Progress Notes (Signed)
Donald Simmons is here with his wife, and was shown how to use the Humulin 70/30 pen- how to draw up the doses, where to inject, how to store it.   He reported good understanding of this. We also reviewed the need to eat lunch daily, and he agreed to do this.   We also discussed low blood sugars--symptoms and treatments.  He reported good understanding of this, and had no final questions. He was given some nano pen needles to use.   Written instructions were given to him by Dr. Lucianne Muss for 14 units before breakfast and 10 units before suppertime, 30 minutes before eating.  We reviewed this and he had no final questions. Discussed the need for not drinking sweet drinks, fruit juices, and eating cold cereal and milk.  He reluctantly agreed to do this.

## 2017-06-18 NOTE — Telephone Encounter (Signed)
PA initiated for Humulin 70/30 [place]  [place] # is [id number] Ref # [id number].

## 2017-06-18 NOTE — Patient Instructions (Signed)
Stop all sweet drinks and switch to diet drinks, or non calorie drinks Take 14 units of insulin, 30 min. Before breakfast, and 10 units 30 min. Before supper. Call if questions.

## 2017-06-19 ENCOUNTER — Telehealth: Payer: Self-pay | Admitting: Endocrinology

## 2017-06-22 ENCOUNTER — Other Ambulatory Visit: Payer: Self-pay

## 2017-06-22 ENCOUNTER — Telehealth: Payer: Self-pay | Admitting: Endocrinology

## 2017-06-22 MED ORDER — INSULIN ASPART PROT & ASPART (70-30 MIX) 100 UNIT/ML PEN: PEN_INJECTOR | SUBCUTANEOUS | 11 refills | Status: DC

## 2017-06-22 NOTE — Telephone Encounter (Signed)
Patient states that he would like an alternative because the metformin has been causing these issues for the past 6 years. (off and on)

## 2017-06-22 NOTE — Telephone Encounter (Signed)
His metformin was not started recently, he has been taking this for 6 years.  He was started on insulin which should not cause the side effects.  If his sugars are much better he can actually stop metformin

## 2017-06-22 NOTE — Telephone Encounter (Signed)
Please advise 

## 2017-06-22 NOTE — Telephone Encounter (Signed)
If he cannot get the Humulin brand he can take NovoLog mix, same doses.  No other alternative to metformin

## 2017-06-22 NOTE — Telephone Encounter (Signed)
Patient was seen last week-put on Metformin. Patient is experiencing side effects (pain, red marks popping up, diahrea, head pain) after taking Metformin. Would like to change medication (prescribed an alternative to the Metformin) asap. I Call ph# [phone] to advise on this issue Pharmacy is [facility] on [address]

## 2017-06-22 NOTE — Telephone Encounter (Signed)
Called patient and spoke with him and informed him that he should call his insurance and see what insulin they cover so MD can make adjustment and get started on his insulin.

## 2017-06-22 NOTE — Telephone Encounter (Signed)
Patient has not been able to obtain the insulin as of yet, due to insurance.

## 2017-06-22 NOTE — Telephone Encounter (Signed)
He will simply stop metformin and work with the insulin as below

## 2017-06-23 ENCOUNTER — Other Ambulatory Visit: Payer: Self-pay

## 2017-06-23 NOTE — Telephone Encounter (Signed)
Error

## 2017-06-26 ENCOUNTER — Telehealth: Payer: Self-pay | Admitting: Endocrinology

## 2017-06-26 NOTE — Telephone Encounter (Signed)
Patient needs RX for Lancets sent to [facility] on [address]

## 2017-06-30 ENCOUNTER — Other Ambulatory Visit: Payer: Self-pay

## 2017-06-30 MED ORDER — LANCETS 28G MISC: 1.0000 | Freq: Every day | 3 refills | Status: DC

## 2017-06-30 NOTE — Telephone Encounter (Signed)
Medication sent to pharmacy per patient request.  ?

## 2017-07-01 ENCOUNTER — Telehealth: Payer: Self-pay | Admitting: Endocrinology

## 2017-07-01 NOTE — Telephone Encounter (Signed)
Patient wife stated patient is having side affect to the insulin Novalog, coughing, leg pain, tooth pain, pain at injection site. Please advise on what to do.

## 2017-07-01 NOTE — Telephone Encounter (Signed)
Insulin does not cause the side effects and she needs to have him see the PCP

## 2017-07-01 NOTE — Telephone Encounter (Signed)
Patient states that he read the insert that came with the insulin and it lists all the side effects that he is having, and an Internet search states the same. Patient was advised to make an appointment with his PCP.

## 2017-07-01 NOTE — Telephone Encounter (Signed)
Please advise 

## 2017-07-23 ENCOUNTER — Ambulatory Visit: Payer: Medicaid Other | Admitting: Neurology

## 2017-07-23 ENCOUNTER — Encounter: Payer: Self-pay | Admitting: Neurology

## 2017-07-23 ENCOUNTER — Other Ambulatory Visit: Payer: Self-pay

## 2017-07-23 VITALS — BP 110/72 | HR 93 | Ht 74.0 in | Wt 190.0 lb

## 2017-07-23 DIAGNOSIS — F32A Depression, unspecified: Secondary | ICD-10-CM

## 2017-07-23 DIAGNOSIS — F329 Major depressive disorder, single episode, unspecified: Secondary | ICD-10-CM

## 2017-07-23 DIAGNOSIS — G40009 Localization-related (focal) (partial) idiopathic epilepsy and epileptic syndromes with seizures of localized onset, not intractable, without status epilepticus: Secondary | ICD-10-CM

## 2017-07-23 MED ORDER — NORTRIPTYLINE HCL 10 MG PO CAPS: ORAL_CAPSULE | ORAL | 11 refills | Status: DC

## 2017-07-23 MED ORDER — LAMOTRIGINE 100 MG PO TABS: ORAL_TABLET | ORAL | 11 refills | Status: DC

## 2017-07-23 MED ORDER — ESLICARBAZEPINE ACETATE 400 MG PO TABS: ORAL_TABLET | ORAL | 5 refills | Status: DC
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
## 2017-08-05 ENCOUNTER — Other Ambulatory Visit: Payer: Medicaid Other

## 2017-08-06 ENCOUNTER — Other Ambulatory Visit: Payer: Self-pay | Admitting: Endocrinology

## 2017-08-06 DIAGNOSIS — E1165 Type 2 diabetes mellitus with hyperglycemia: Secondary | ICD-10-CM

## 2017-08-10 ENCOUNTER — Other Ambulatory Visit (INDEPENDENT_AMBULATORY_CARE_PROVIDER_SITE_OTHER): Payer: Medicaid Other

## 2017-08-10 DIAGNOSIS — E1165 Type 2 diabetes mellitus with hyperglycemia: Secondary | ICD-10-CM | POA: Diagnosis not present

## 2017-08-10 LAB — COMPREHENSIVE METABOLIC PANEL
ALT: 15 U/L (ref 0–53)
AST: 13 U/L (ref 0–37)
Albumin: 4.2 g/dL (ref 3.5–5.2)
Alkaline Phosphatase: 83 U/L (ref 39–117)
BUN: 10 mg/dL (ref 6–23)
CO2: 27 meq/L (ref 19–32)
Calcium: 9.1 mg/dL (ref 8.4–10.5)
Chloride: 99 mEq/L (ref 96–112)
Creatinine, Ser: 0.75 mg/dL (ref 0.40–1.50)
GFR: 148.23 mL/min (ref >60.00)
GLUCOSE: 356 mg/dL — AB (ref 70–99)
POTASSIUM: 3.8 meq/L (ref 3.5–5.1)
SODIUM: 134 meq/L — AB (ref 135–145)
Total Bilirubin: 0.4 mg/dL (ref 0.2–1.2)
Total Protein: 7.4 g/dL (ref 6.0–8.3)

## 2017-08-10 LAB — LIPID PANEL
CHOL/HDL RATIO: 5
Cholesterol: 186 mg/dL (ref 0–200)
HDL: 36.5 mg/dL — AB (ref >39.00)
NONHDL: 149.23
TRIGLYCERIDES: 351 mg/dL — AB (ref 0.0–149.0)
VLDL: 70.2 mg/dL — ABNORMAL HIGH (ref 0.0–40.0)

## 2017-08-10 LAB — LDL CHOLESTEROL, DIRECT: Direct LDL: 106 mg/dL

## 2017-08-11 LAB — FRUCTOSAMINE: FRUCTOSAMINE: 414 umol/L — AB (ref 0–285)

## 2017-08-12 ENCOUNTER — Encounter: Payer: Self-pay | Admitting: Endocrinology

## 2017-08-12 ENCOUNTER — Other Ambulatory Visit: Payer: Self-pay

## 2017-08-12 ENCOUNTER — Ambulatory Visit (INDEPENDENT_AMBULATORY_CARE_PROVIDER_SITE_OTHER): Payer: Medicaid Other | Admitting: Endocrinology

## 2017-08-12 VITALS — BP 122/76 | HR 76 | Ht 74.0 in | Wt 195.4 lb

## 2017-08-12 DIAGNOSIS — Z794 Long term (current) use of insulin: Secondary | ICD-10-CM

## 2017-08-12 DIAGNOSIS — E1165 Type 2 diabetes mellitus with hyperglycemia: Secondary | ICD-10-CM

## 2017-08-12 MED ORDER — LANCETS 28G MISC: 1.0000 | Freq: Every day | 3 refills | Status: AC

## 2017-08-12 MED ORDER — GLUCOSE BLOOD VI STRP: ORAL_STRIP | 12 refills | Status: AC

## 2017-08-12 NOTE — Patient Instructions (Addendum)
Take 22 units before bfst and 16 units before supper  Morning sugar is staying over 150 then go up 2 units on the evening dosage and if the morning sugar is below 90 then you can back down 2 units  If the suppertime reading is staying over 150 you go up 2 units on the morning dose  Check blood sugars on waking up  3-4/7  Also check blood sugars about 2 hours after a meal and do this after different meals by rotation  Recommended blood sugar levels on waking up is 90-130 and about 2 hours after meal is 130-160  Please bring your blood sugar monitor to each visit, thank you

## 2017-08-12 NOTE — Progress Notes (Signed)
Patient ID: Donald Simmons, male   DOB: 1975/11/25, 42 y.o.   MRN: [id number]          Reason for Appointment: Consultation for  Diabetes  Referring physician: Karel Jarvis   History of Present Illness:          Date of diagnosis of type 2 diabetes mellitus: 2013       Background history:   At the time of diagnosis his blood sugar was 350 and he had symptoms of high sugar He was started on metformin and continued on this He thinks that metformin would tend to cause occasional diarrhea and he does better with the extended release No detailed records are available but he has been irregular with his management because of affordability and insurance issues   Recent history:       His A1c in 5/19 is 11.3, no previous comparison available  Non-insulin hypoglycemic drugs the patient is taking are: None  INSULIN: NovoLog Mix 70/30, 14 units before breakfast and 10 units before dinner   Current management, blood sugar patterns and problems identified:  He has not been able to use the Accu-Chek meter that was prescribed and is using a generic meter and checking only sporadically  He was supposed to continue increasing his insulin as instructed on the last visit but he has not done so  Also has not seen the dietitian as yet  He was previously on metformin but because of GI side effects this has been stopped  He thinks he gets excessively hungry in the evening after evening meal but this only since starting insulin  No unusual fatigue  His lab glucose was markedly increased at 356 this in the afternoon        Side effects from medications have been:?  Constipation and diarrhea from metformin  Compliance with the medical regimen: Poor Hypoglycemia: Never   Glucose monitoring:  Rarely done           Glucometer: Embrace  Recent fasting range 234-298, blood sugars mostly tested in June  Self-care: The diet that the patient has been following is: tries to limit meats.       Typical meal intake: Breakfast may be fruit smoothies, some meals may be fast food               Dietician visit, most recent: Never CDE visit: 06/2017               Exercise: none   Weight history: 180-190  Wt Readings from Last 3 Encounters:  08/12/17 195 lb 6.4 oz (88.6 kg)  07/23/17 190 lb (86.2 kg)  06/17/17 192 lb 9.6 oz (87.4 kg)    Glycemic control:   Lab Results  Component Value Date   HGBA1C 11.3 06/17/2017   HGBA1C 9.9 (H) 02/07/2015   HGBA1C 8.6 (H) 10/25/2014   Lab Results  Component Value Date   LDLCALC 93 02/07/2015   CREATININE 0.75 08/10/2017   Lab Results  Component Value Date   MICRALBCREAT 4.5 10/25/2014    Lab Results  Component Value Date   FRUCTOSAMINE 414 (H) 08/10/2017      Allergies as of 08/12/2017      Reactions   Asa [aspirin]    Asa [aspirin] Hives      Medication List        Accurate as of 08/12/17  3:34 PM. Always use your most recent med list.          ACCU-CHEK AVIVA device Use as  instructed   docusate sodium 100 MG capsule Commonly known as:  COLACE Take 100 mg by mouth daily as needed.   Eslicarbazepine Acetate 400 MG Tabs Commonly known as:  APTIOM Take 1 tablet every night   glucose blood test strip Commonly known as:  ACCU-CHEK AVIVA Use as instructed to check blood sugar three times daily.   insulin aspart protamine - aspart (70-30) 100 UNIT/ML FlexPen Commonly known as:  NOVOLOG MIX 70/30 FLEXPEN Take 14 units under the skin before breakfast and 10 units before dinner. Increase as directed.   Insulin Pen Needle 31G X 5 MM Misc Use with insulin pen twice a day   lamoTRIgine 100 MG tablet Commonly known as:  LAMICTAL Take 2 tablets twice a day   Lancets 28G Misc 1 each by Does not apply route daily. Use to check blood sugar three times daily.   nortriptyline 10 MG capsule Commonly known as:  PAMELOR Take 2 caps at night       Allergies:  Allergies  Allergen Reactions  . Asa [Aspirin]    . Asa [Aspirin] Hives    Past Medical History:  Diagnosis Date  . Diabetes (HCC)   . Diabetes mellitus without complication (HCC)   . Hyperlipemia   . Hyperlipemia     History reviewed. No pertinent surgical history.  Family History  Problem Relation Age of Onset  . Diabetes Mother   . Thyroid disease Mother   . Hyperlipidemia Mother   . Seizures Maternal Uncle   . Diabetes Maternal Uncle     Social History:  reports that he has never smoked. He has never used smokeless tobacco. He reports that he drinks alcohol. He reports that he does not use drugs.   Review of Systems   Lipid history: He has been started on simvastatin a couple of years ago by previous physician, not taking    Lab Results  Component Value Date   CHOL 186 08/10/2017   HDL 36.50 (L) 08/10/2017   LDLCALC 93 02/07/2015   LDLDIRECT 106.0 08/10/2017   TRIG 351.0 (H) 08/10/2017   CHOLHDL 5 08/10/2017           Hypertension: Not present  BP Readings from Last 3 Encounters:  08/12/17 122/76  07/23/17 110/72  06/17/17 134/90    Most recent eye exam was years ago  Most recent foot exam: 5/19  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 Pains in his lower legs present, some tingling and burning night     Physical Examination:  BP 122/76 (BP Location: Left Arm, Patient Position: Sitting, Cuff Size: Normal)   Pulse 76   Ht 6\' 2"  (1.88 m)   Wt 195 lb 6.4 oz (88.6 kg)   SpO2 99%   BMI 25.09 kg/m    ASSESSMENT:  Diabetes type 2, uncontrolled, nonobese  He has been on premixed insulin since 5/19  His blood sugars are monitored very infrequently and he has not followed up or adjusted his insulin as directed on his last visit in detail  Blood sugars are running at least 200 at home and checking blood sugars mostly in the morning Does not have any formal meal plan Also had little understanding about insulin   PLAN:    Again discussed in detail the  adjustment of his insulin to keep blood sugars at least under 150 before meals  He will adjust his evening insulin based on morning sugars and morning insulin based on afternoon sugars every 3 to  4 days  Discussed blood sugar targets before and after meals  Reassured him that insulin does not cause side effects that he is asking about  For now he will go up to 22 units in the morning and 16 before supper  Although complaining of excessive hunger and not getting satiety with his evening meal not clear if this is related to insulin alone  Since he is insulin deficient he will need to continue adjust insulin but consider using separate basal bolus insulin  For now he needs considerable diabetes education including meal planning which will be scheduled  He will start using the Accu-Chek meter that has been prescribed and prescription for strips and lancets were sent  NEUROPATHY: Needs to discuss treatment with his neurologist   Patient Instructions  Take 22 units before bfst and 16 units before supper  Morning sugar is staying over 150 then go up 2 units on the evening dosage and if the morning sugar is below 90 then you can back down 2 units  If the suppertime reading is staying over 150 you go up 2 units on the morning dose  Check blood sugars on waking up  3-4/7  Also check blood sugars about 2 hours after a meal and do this after different meals by rotation  Recommended blood sugar levels on waking up is 90-130 and about 2 hours after meal is 130-160  Please bring your blood sugar monitor to each visit, thank you    Counseling time on subjects discussed in assessment and plan sections is over 50% of today's 25 minute visit       Donald Simmons 08/12/2017, 3:34 PM   Note: This office note was prepared with Dragon voice recognition system technology. Any transcriptional errors that result from this process are unintentional.

## 2017-08-13 ENCOUNTER — Encounter: Payer: Self-pay | Admitting: Neurology

## 2017-09-03 ENCOUNTER — Encounter: Payer: Medicaid Other | Attending: Endocrinology | Admitting: Dietician

## 2017-09-03 ENCOUNTER — Encounter: Payer: Self-pay | Admitting: Dietician

## 2017-09-03 DIAGNOSIS — Z713 Dietary counseling and surveillance: Secondary | ICD-10-CM | POA: Diagnosis not present

## 2017-09-03 DIAGNOSIS — E1165 Type 2 diabetes mellitus with hyperglycemia: Secondary | ICD-10-CM | POA: Insufficient documentation

## 2017-09-03 DIAGNOSIS — E118 Type 2 diabetes mellitus with unspecified complications: Secondary | ICD-10-CM

## 2017-09-03 DIAGNOSIS — Z794 Long term (current) use of insulin: Secondary | ICD-10-CM

## 2017-09-03 NOTE — Patient Instructions (Addendum)
Other Food Resources:  [facility]- 4th Tuesday of each month from 2:30-4   [address]  [facility]- 2nd Sunday of each month from 2:30-4   [address]  [employer] (may have resources)   [phone]   [phone]  See food pantry list and free meal site.  Consider vitamin D supplementation.  Insulin instructions per Dr. Remus BlakeKumar's July note:  Increase your insulin dose before breakfast to 24 units  Increase your insulin dose before supper to 18 units.  Plan:  Aim for 4-5 Carb Choices per meal (60-75 grams) +/- 1 either way  Aim for 0-2 Carbs per snack if hungry  Include protein in moderation with your meals and snacks Consider reading food labels for Total Carbohydrate and Fat Grams of foods Consider  increasing your activity level by walking 15-30 for minutes daily as tolerated Consider checking BG at alternate times per day as directed by MD  Consider taking medication as directed by MD  Your next appointment with me is   September 3 at 11:30 after Dr. Remus BlakeKumar's appointment.  Please go to [address] in the same building.

## 2017-09-03 NOTE — Progress Notes (Signed)
Diabetes Self-Management Education  Visit Type: Follow-up  Appt. Start Time: 0910 Appt. End Time: 1025  09/03/2017  [name], identified by name and date of birth, is a 42 y.o. male with a diagnosis of Diabetes: Type 2 - diagnosed 2013. History includes symptoms of unknown cause such as neuropathy, weakness, as well as hyperlipidemia.   Labs:  Cholesterol 186, HDL 36, LDL 149, Triglycerides 351 08/10/17.   Weight increased to 199 lbs today's weight. Patient reports increased appetite since starting insulin.  Weight gain of 7 lbs in the past 3 months since starting this.   Lowest adult weight 180 lbs.  Medication includes:  Humalog 70/30 22 units each am and 16 units each HS.  He was not aware of instructions to increase this with increased blood sugar readings.  Reviewed Dr. Remus BlakeKumar's recommendations about titration.  Patient, wife, and 925 yo son are currently living with his mother.  His wife is here today.  He can now afford his medication but reports increased stress that he did not explain.  They do not have enough money for food at times.  Provided food banks and meal sites.  He can afford his medication now as insurance isues have been worked out.   His depression score was 17 today with no thoughts of self harm.   They are moving in about 2 weeks and states that this will considerably help his stress level.   ASSESSMENT  Height 6\' 2"  (1.88 m), weight 199 lb (90.3 kg). Body mass index is 25.55 kg/m.  Diabetes Self-Management Education - 09/03/17 2228      Visit Information   Visit Type  Follow-up      Initial Visit   Diabetes Type  Type 2    Are you currently following a meal plan?  No    Are you taking your medications as prescribed?  Yes      Psychosocial Assessment   Self-care barriers  None    Self-management support  Doctor's office;Family    Other persons present  Patient;Spouse/SO    Patient Concerns  Nutrition/Meal planning;Problem Solving;Glycemic Control     Special Needs  None    Preferred Learning Style  Visual    Learning Readiness  Other (comment) increased stress'      Pre-Education Assessment   Patient understands incorporating nutritional management into lifestyle.  Needs Review    Patient undertands incorporating physical activity into lifestyle.  Needs Review    Patient understands using medications safely.  Needs Review    Patient understands monitoring blood glucose, interpreting and using results  Demonstrates understanding / competency    Patient understands prevention, detection, and treatment of chronic complications.  Needs Review    Patient understands how to develop strategies to address psychosocial issues.  Needs Review    Patient understands how to develop strategies to promote health/change behavior.  Needs Review      Complications   Last HgB A1C per patient/outside source  11.3 % 5/15/119 increased from 9% 02/06/17    How often do you check your blood sugar?  1-2 times/day    Fasting Blood glucose range (mg/dL)  >161>200    Postprandial Blood glucose range (mg/dL)  >096>200    Number of hypoglycemic episodes per month  0    Number of hyperglycemic episodes per week  14      Dietary Intake   Breakfast  1-2 sllices raisin cinnamon  bread OR 1 instant oatmeal, rare fruit OR used to make  smoothie with strawberries, pineapple, mango, banana, agave, almond milk    Snack (morning)  glucerna bar    Lunch  fast food or other frozen processed food such as hot pockest, castle burger, or cup of noodles    Snack (afternoon)  glucerna bar or nuts or granola bar or chips and dip    Dinner  spaghetti or rice and beans, vegetables.    Snack (evening)  nuts    Beverage(s)  water, occasional regular gatorade or zero gatorade, coffee with creamer and stevia occasional      Exercise   Exercise Type  ADL's      Patient Education   Previous Diabetes Education  Yes (please comment) CDE 06/2017 to start insulin and basic education    Nutrition  management   Food label reading, portion sizes and measuring food.;Role of diet in the treatment of diabetes and the relationship between the three main macronutrients and blood glucose level;Information on hints to eating out and maintain blood glucose control.;Meal options for control of blood glucose level and chronic complications.;Meal timing in regards to the patients' current diabetes medication.    Medications  Reviewed patients medication for diabetes, action, purpose, timing of dose and side effects.    Psychosocial adjustment  Worked with patient to identify barriers to care and solutions;Role of stress on diabetes    Personal strategies to promote health  Lifestyle issues that need to be addressed for better diabetes care      Individualized Goals (developed by patient)   Nutrition  General guidelines for healthy choices and portions discussed    Physical Activity  Not Applicable    Medications  take my medication as prescribed    Monitoring   test my blood glucose as discussed    Problem Solving  food security, blanced meals, insulin dose    Reducing Risk  examine blood glucose patterns    Health Coping  discuss diabetes with (comment) MD, RD, CDE      Patient Self-Evaluation of Goals - Patient rates self as meeting previously set goals (% of time)   Nutrition  50 - 75 %    Physical Activity  Not Applicable    Medications  50 - 75 %    Monitoring  >75%    Problem Solving  50 - 75 %    Reducing Risk  50 - 75 %    Health Coping  50 - 75 %      Post-Education Assessment   Patient understands the diabetes disease and treatment process.  Needs Instruction    Patient understands incorporating nutritional management into lifestyle.  Needs Review    Patient undertands incorporating physical activity into lifestyle.  Needs Instruction    Patient understands using medications safely.  Needs Review    Patient understands monitoring blood glucose, interpreting and using results   Demonstrates understanding / competency    Patient understands prevention, detection, and treatment of acute complications.  Needs Instruction    Patient understands prevention, detection, and treatment of chronic complications.  Needs Instruction    Patient understands how to develop strategies to address psychosocial issues.  Needs Review    Patient understands how to develop strategies to promote health/change behavior.  Needs Review      Outcomes   Expected Outcomes  Other (comment) demonstrated interest in learning but barriers    Future DMSE  4-6 wks    Program Status  Completed      Subsequent Visit   Since  your last visit have you continued or begun to take your medications as prescribed?  Yes    Since your last visit have you had your blood pressure checked?  Yes    Since your last visit have you experienced any weight changes?  Gain    Weight Gain (lbs)  7    Since your last visit, are you checking your blood glucose at least once a day?  Yes       Individualized Plan for Diabetes Self-Management Training:   Learning Objective:  Patient will have a greater understanding of diabetes self-management. Patient education plan is to attend individual and/or group sessions per assessed needs and concerns.   Plan:   Patient Instructions  Other Food Resources:  [facility]- 4th Tuesday of each month from 2:30-4   [address]  [facility]- 2nd Sunday of each month from 2:30-4   [address]  [employer] (may have resources)   [phone]   [phone]  See food pantry list and free meal site.  Consider vitamin D supplementation.  Insulin instructions per Dr. Remus Blake July note:  Increase your insulin dose before breakfast to 24 units  Increase your insulin dose before supper to 18 units.  Plan:  Aim for 4-5 Carb Choices per meal (60-75 grams) +/- 1 either way  Aim for 0-2 Carbs per snack if  hungry  Include protein in moderation with your meals and snacks Consider reading food labels for Total Carbohydrate and Fat Grams of foods Consider  increasing your activity level by walking 15-30 for minutes daily as tolerated Consider checking BG at alternate times per day as directed by MD  Consider taking medication as directed by MD  Your next appointment with me is   September 3 at 11:30 after Dr. Remus Blake appointment.  Please go to [address] in the same building.   Expected Outcomes:  Other (comment)(demonstrated interest in learning but barriers)  Education material provided: ADA Diabetes: Your Take Control Guide, Food label handouts, Meal plan card, My Plate and Snack sheet, food insecurity sheet, free meal sites  If problems or questions, patient to contact team via:  Phone  Future DSME appointment: 4-6 wksDiabetes Self-Management Education

## 2017-09-03 NOTE — Progress Notes (Deleted)
Diabetes Self-Management Education  Visit Type: Follow-up  Appt. Start Time: 0910 Appt. End Time: 1025  09/03/2017  [name], identified by name and date of birth, is a 42 y.o. male with a diagnosis of Diabetes: Type 2 - diagnosed 2013. History includes symptoms of unknown cause such as neuropathy, weakness, as well as hyperlipidemia.   Labs:  Cholesterol 186, HDL 36, LDL 149, Triglycerides 351 08/10/17.   Weight increased to 199 lbs today's weight. Patient reports increased appetite since starting insulin.  Weight gain of 7 lbs in the past 3 months since starting this.   Lowest adult weight 180 lbs.  Medication includes:  Humalog 70/30 22 units each am and 16 units each HS.  He was not aware of instructions to increase this with increased blood sugar readings.  Reviewed Dr. Remus BlakeKumar's recommendations about titration.  Patient, wife, and 925 yo son are currently living with his mother.  His wife is here today.  He can now afford his medication but reports increased stress that he did not explain.  They do not have enough money for food at times.  Provided food banks and meal sites.  He can afford his medication now as insurance isues have been worked out.   His depression score was 17 today with no thoughts of self harm.   They are moving in about 2 weeks and states that this will considerably help his stress level.   ASSESSMENT  Height 6\' 2"  (1.88 m), weight 199 lb (90.3 kg). Body mass index is 25.55 kg/m.  Diabetes Self-Management Education - 09/03/17 2228      Visit Information   Visit Type  Follow-up      Initial Visit   Diabetes Type  Type 2    Are you currently following a meal plan?  No    Are you taking your medications as prescribed?  Yes      Psychosocial Assessment   Self-care barriers  None    Self-management support  Doctor's office;Family    Other persons present  Patient;Spouse/SO    Patient Concerns  Nutrition/Meal planning;Problem Solving;Glycemic Control     Special Needs  None    Preferred Learning Style  Visual    Learning Readiness  Other (comment) increased stress'      Pre-Education Assessment   Patient understands incorporating nutritional management into lifestyle.  Needs Review    Patient undertands incorporating physical activity into lifestyle.  Needs Review    Patient understands using medications safely.  Needs Review    Patient understands monitoring blood glucose, interpreting and using results  Demonstrates understanding / competency    Patient understands prevention, detection, and treatment of chronic complications.  Needs Review    Patient understands how to develop strategies to address psychosocial issues.  Needs Review    Patient understands how to develop strategies to promote health/change behavior.  Needs Review      Complications   Last HgB A1C per patient/outside source  11.3 % 5/15/119 increased from 9% 02/06/17    How often do you check your blood sugar?  1-2 times/day    Fasting Blood glucose range (mg/dL)  >161>200    Postprandial Blood glucose range (mg/dL)  >096>200    Number of hypoglycemic episodes per month  0    Number of hyperglycemic episodes per week  14      Dietary Intake   Breakfast  1-2 sllices raisin cinnamon  bread OR 1 instant oatmeal, rare fruit OR used to make  smoothie with strawberries, pineapple, mango, banana, agave, almond milk    Snack (morning)  glucerna bar    Lunch  fast food or other frozen processed food such as hot pockest, castle burger, or cup of noodles    Snack (afternoon)  glucerna bar or nuts or granola bar or chips and dip    Dinner  spaghetti or rice and beans, vegetables.    Snack (evening)  nuts    Beverage(s)  water, occasional regular gatorade or zero gatorade, coffee with creamer and stevia occasional      Exercise   Exercise Type  ADL's      Patient Education   Previous Diabetes Education  Yes (please comment) CDE 06/2017 to start insulin and basic education    Nutrition  management   Food label reading, portion sizes and measuring food.;Role of diet in the treatment of diabetes and the relationship between the three main macronutrients and blood glucose level;Information on hints to eating out and maintain blood glucose control.;Meal options for control of blood glucose level and chronic complications.;Meal timing in regards to the patients' current diabetes medication.    Medications  Reviewed patients medication for diabetes, action, purpose, timing of dose and side effects.    Psychosocial adjustment  Worked with patient to identify barriers to care and solutions;Role of stress on diabetes    Personal strategies to promote health  Lifestyle issues that need to be addressed for better diabetes care      Individualized Goals (developed by patient)   Nutrition  General guidelines for healthy choices and portions discussed    Physical Activity  Not Applicable    Medications  take my medication as prescribed    Monitoring   test my blood glucose as discussed    Problem Solving  food security, blanced meals, insulin dose    Reducing Risk  examine blood glucose patterns    Health Coping  discuss diabetes with (comment) MD, RD, CDE      Patient Self-Evaluation of Goals - Patient rates self as meeting previously set goals (% of time)   Nutrition  50 - 75 %    Physical Activity  Not Applicable    Medications  50 - 75 %    Monitoring  >75%    Problem Solving  50 - 75 %    Reducing Risk  50 - 75 %    Health Coping  50 - 75 %      Post-Education Assessment   Patient understands the diabetes disease and treatment process.  Needs Instruction    Patient understands incorporating nutritional management into lifestyle.  Needs Review    Patient undertands incorporating physical activity into lifestyle.  Needs Instruction    Patient understands using medications safely.  Needs Review    Patient understands monitoring blood glucose, interpreting and using results   Demonstrates understanding / competency    Patient understands prevention, detection, and treatment of acute complications.  Needs Instruction    Patient understands prevention, detection, and treatment of chronic complications.  Needs Instruction    Patient understands how to develop strategies to address psychosocial issues.  Needs Review    Patient understands how to develop strategies to promote health/change behavior.  Needs Review      Outcomes   Expected Outcomes  Other (comment) demonstrated interest in learning but barriers    Future DMSE  4-6 wks    Program Status  Completed      Subsequent Visit   Since  your last visit have you continued or begun to take your medications as prescribed?  Yes    Since your last visit have you had your blood pressure checked?  Yes    Since your last visit have you experienced any weight changes?  Gain    Weight Gain (lbs)  7    Since your last visit, are you checking your blood glucose at least once a day?  Yes       Individualized Plan for Diabetes Self-Management Training:   Learning Objective:  Patient will have a greater understanding of diabetes self-management. Patient education plan is to attend individual and/or group sessions per assessed needs and concerns.   Plan:   Patient Instructions  Other Food Resources:  [facility]- 4th Tuesday of each month from 2:30-4   [address]  [facility]- 2nd Sunday of each month from 2:30-4   [address]  [employer] (may have resources)   [phone]   [phone]  See food pantry list and free meal site.  Consider vitamin D supplementation.  Insulin instructions per Dr. Remus BlakeKumar's July note:  Increase your insulin dose before breakfast to 24 units  Increase your insulin dose before supper to 18 units.  Plan:  Aim for 4-5 Carb Choices per meal (60-75 grams) +/- 1 either way  Aim for 0-2 Carbs per snack if  hungry  Include protein in moderation with your meals and snacks Consider reading food labels for Total Carbohydrate and Fat Grams of foods Consider  increasing your activity level by walking 15-30 for minutes daily as tolerated Consider checking BG at alternate times per day as directed by MD  Consider taking medication as directed by MD  Your next appointment with me is   September 3 at 11:30 after Dr. Remus BlakeKumar's appointment.  Please go to [address] in the same building.   Expected Outcomes:  Other (comment)(demonstrated interest in learning but barriers)  Education material provided: ADA Diabetes: Your Take Control Guide, Food label handouts, Meal plan card, My Plate and Snack sheet, food insecurity sheet, free meal sites  If problems or questions, patient to contact team via:  Phone  Future DSME appointment: 4-6 wksDiabetes Self-Management Education  Visit Type: Follow-up  Appt. Start Time: *** Appt. End Time: ***  09/03/2017  [name], identified by name and date of birth, is a 42 y.o. male with a diagnosis of Diabetes: Type 2.   ASSESSMENT  Height 6\' 2"  (1.88 m), weight 199 lb (90.3 kg). Body mass index is 25.55 kg/m.  Diabetes Self-Management Education - 09/03/17 2228      Visit Information   Visit Type  Follow-up      Initial Visit   Diabetes Type  Type 2    Are you currently following a meal plan?  No    Are you taking your medications as prescribed?  Yes      Psychosocial Assessment   Self-care barriers  None    Self-management support  Doctor's office;Family    Other persons present  Patient;Spouse/SO    Patient Concerns  Nutrition/Meal planning;Problem Solving;Glycemic Control    Special Needs  None    Preferred Learning Style  Visual    Learning Readiness  Other (comment) increased stress'      Pre-Education Assessment   Patient understands incorporating nutritional management into lifestyle.  Needs Review    Patient undertands  incorporating physical activity into  lifestyle.  Needs Review    Patient understands using medications safely.  Needs Review    Patient understands monitoring blood glucose, interpreting and using results  Demonstrates understanding / competency    Patient understands prevention, detection, and treatment of chronic complications.  Needs Review    Patient understands how to develop strategies to address psychosocial issues.  Needs Review    Patient understands how to develop strategies to promote health/change behavior.  Needs Review      Complications   Last HgB A1C per patient/outside source  11.3 % 5/15/119 increased from 9% 02/06/17    How often do you check your blood sugar?  1-2 times/day    Fasting Blood glucose range (mg/dL)  >161    Postprandial Blood glucose range (mg/dL)  >096    Number of hypoglycemic episodes per month  0    Number of hyperglycemic episodes per week  14      Dietary Intake   Breakfast  1-2 sllices raisin cinnamon  bread OR 1 instant oatmeal, rare fruit OR used to make smoothie with strawberries, pineapple, mango, banana, agave, almond milk    Snack (morning)  glucerna bar    Lunch  fast food or other frozen processed food such as hot pockest, castle burger, or cup of noodles    Snack (afternoon)  glucerna bar or nuts or granola bar or chips and dip    Dinner  spaghetti or rice and beans, vegetables.    Snack (evening)  nuts    Beverage(s)  water, occasional regular gatorade or zero gatorade, coffee with creamer and stevia occasional      Exercise   Exercise Type  ADL's      Patient Education   Previous Diabetes Education  Yes (please comment) CDE 06/2017 to start insulin and basic education    Nutrition management   Food label reading, portion sizes and measuring food.;Role of diet in the treatment of diabetes and the relationship between the three main macronutrients and blood glucose level;Information on hints to eating out and maintain blood glucose  control.;Meal options for control of blood glucose level and chronic complications.;Meal timing in regards to the patients' current diabetes medication.    Medications  Reviewed patients medication for diabetes, action, purpose, timing of dose and side effects.    Psychosocial adjustment  Worked with patient to identify barriers to care and solutions;Role of stress on diabetes    Personal strategies to promote health  Lifestyle issues that need to be addressed for better diabetes care      Individualized Goals (developed by patient)   Nutrition  General guidelines for healthy choices and portions discussed    Physical Activity  Not Applicable    Medications  take my medication as prescribed    Monitoring   test my blood glucose as discussed    Problem Solving  food security, blanced meals, insulin dose    Reducing Risk  examine blood glucose patterns    Health Coping  discuss diabetes with (comment) MD, RD, CDE      Patient Self-Evaluation of Goals - Patient rates self as meeting previously set goals (% of time)   Nutrition  50 - 75 %    Physical Activity  Not Applicable    Medications  50 - 75 %    Monitoring  >75%    Problem Solving  50 - 75 %    Reducing Risk  50 - 75 %    Health Coping  50 -  75 %      Post-Education Assessment   Patient understands the diabetes disease and treatment process.  Needs Instruction    Patient understands incorporating nutritional management into lifestyle.  Needs Review    Patient undertands incorporating physical activity into lifestyle.  Needs Instruction    Patient understands using medications safely.  Needs Review    Patient understands monitoring blood glucose, interpreting and using results  Demonstrates understanding / competency    Patient understands prevention, detection, and treatment of acute complications.  Needs Instruction    Patient understands prevention, detection, and treatment of chronic complications.  Needs Instruction    Patient  understands how to develop strategies to address psychosocial issues.  Needs Review    Patient understands how to develop strategies to promote health/change behavior.  Needs Review      Outcomes   Expected Outcomes  Other (comment) demonstrated interest in learning but barriers    Future DMSE  4-6 wks    Program Status  Completed      Subsequent Visit   Since your last visit have you continued or begun to take your medications as prescribed?  Yes    Since your last visit have you had your blood pressure checked?  Yes    Since your last visit have you experienced any weight changes?  Gain    Weight Gain (lbs)  7    Since your last visit, are you checking your blood glucose at least once a day?  Yes       Individualized Plan for Diabetes Self-Management Training:   Learning Objective:  Patient will have a greater understanding of diabetes self-management. Patient education plan is to attend individual and/or group sessions per assessed needs and concerns.   Plan:   Patient Instructions  Other Food Resources:  [facility]- 4th Tuesday of each month from 2:30-4   [address]  [facility]- 2nd Sunday of each month from 2:30-4   [address]  [employer] (may have resources)   [phone] [address]   [phone]  See food pantry list and free meal site.  Consider vitamin D supplementation.  Insulin instructions per Dr. Remus Blake July note:  Increase your insulin dose before breakfast to 24 units  Increase your insulin dose before supper to 18 units.  Plan:  Aim for 4-5 Carb Choices per meal (60-75 grams) +/- 1 either way  Aim for 0-2 Carbs per snack if hungry  Include protein in moderation with your meals and snacks Consider reading food labels for Total Carbohydrate and Fat Grams of foods Consider  increasing your activity level by walking 15-30 for minutes daily as tolerated Consider checking BG at alternate  times per day as directed by MD  Consider taking medication as directed by MD  Your next appointment with me is   September 3 at 11:30 after Dr. Remus Blake appointment.  Please go to [address] in the same building.   Expected Outcomes:  Other (comment)(demonstrated interest in learning but barriers)  Education material provided: {CHL AMB DSME EDUCATION MATERIAL:22611}  If problems or questions, patient to contact team via:  {TYPE OF CONTACT:20355}  Future DSME appointment: 4-6 wks

## 2017-09-03 NOTE — Progress Notes (Signed)
Diabetes Self-Management Education  Visit Type: Follow-up  Appt. Start Time: 0910 Appt. End Time: 1025  09/03/2017  [name], identified by name and date of birth, is a 42 y.o. male with a diagnosis of Diabetes: Type 2 - diagnosed 2013. History includes symptoms of unknown cause such as neuropathy, weakness, as well as hyperlipidemia.   Labs:  Cholesterol 186, HDL 36, LDL 149, Triglycerides 351 08/10/17.   Weight increased to 199 lbs today's weight. Patient reports increased appetite since starting insulin.  Weight gain of 7 lbs in the past 3 months since starting this.   Lowest adult weight 180 lbs.  Medication includes:  Humalog 70/30 22 units each am and 16 units each HS.  He was not aware of instructions to increase this with increased blood sugar readings.  Reviewed Dr. Remus BlakeKumar's recommendations about titration.  Patient, wife, and 925 yo son are currently living with his mother.  His wife is here today.  He can now afford his medication but reports increased stress that he did not explain.  They do not have enough money for food at times.  Provided food banks and meal sites.  He can afford his medication now as insurance isues have been worked out.   His depression score was 17 today with no thoughts of self harm.   They are moving in about 2 weeks and states that this will considerably help his stress level.   ASSESSMENT  Height 6\' 2"  (1.88 m), weight 199 lb (90.3 kg). Body mass index is 25.55 kg/m.  Diabetes Self-Management Education - 09/03/17 2228      Visit Information   Visit Type  Follow-up      Initial Visit   Diabetes Type  Type 2    Are you currently following a meal plan?  No    Are you taking your medications as prescribed?  Yes      Psychosocial Assessment   Self-care barriers  None    Self-management support  Doctor's office;Family    Other persons present  Patient;Spouse/SO    Patient Concerns  Nutrition/Meal planning;Problem Solving;Glycemic Control     Special Needs  None    Preferred Learning Style  Visual    Learning Readiness  Other (comment) increased stress'      Pre-Education Assessment   Patient understands incorporating nutritional management into lifestyle.  Needs Review    Patient undertands incorporating physical activity into lifestyle.  Needs Review    Patient understands using medications safely.  Needs Review    Patient understands monitoring blood glucose, interpreting and using results  Demonstrates understanding / competency    Patient understands prevention, detection, and treatment of chronic complications.  Needs Review    Patient understands how to develop strategies to address psychosocial issues.  Needs Review    Patient understands how to develop strategies to promote health/change behavior.  Needs Review      Complications   Last HgB A1C per patient/outside source  11.3 % 5/15/119 increased from 9% 02/06/17    How often do you check your blood sugar?  1-2 times/day    Fasting Blood glucose range (mg/dL)  >161>200    Postprandial Blood glucose range (mg/dL)  >096>200    Number of hypoglycemic episodes per month  0    Number of hyperglycemic episodes per week  14      Dietary Intake   Breakfast  1-2 sllices raisin cinnamon  bread OR 1 instant oatmeal, rare fruit OR used to make  smoothie with strawberries, pineapple, mango, banana, agave, almond milk    Snack (morning)  glucerna bar    Lunch  fast food or other frozen processed food such as hot pockest, castle burger, or cup of noodles    Snack (afternoon)  glucerna bar or nuts or granola bar or chips and dip    Dinner  spaghetti or rice and beans, vegetables.    Snack (evening)  nuts    Beverage(s)  water, occasional regular gatorade or zero gatorade, coffee with creamer and stevia occasional      Exercise   Exercise Type  ADL's      Patient Education   Previous Diabetes Education  Yes (please comment) CDE 06/2017 to start insulin and basic education    Nutrition  management   Food label reading, portion sizes and measuring food.;Role of diet in the treatment of diabetes and the relationship between the three main macronutrients and blood glucose level;Information on hints to eating out and maintain blood glucose control.;Meal options for control of blood glucose level and chronic complications.;Meal timing in regards to the patients' current diabetes medication.    Medications  Reviewed patients medication for diabetes, action, purpose, timing of dose and side effects.    Psychosocial adjustment  Worked with patient to identify barriers to care and solutions;Role of stress on diabetes    Personal strategies to promote health  Lifestyle issues that need to be addressed for better diabetes care      Individualized Goals (developed by patient)   Nutrition  General guidelines for healthy choices and portions discussed    Physical Activity  Not Applicable    Medications  take my medication as prescribed    Monitoring   test my blood glucose as discussed    Problem Solving  food security, blanced meals, insulin dose    Reducing Risk  examine blood glucose patterns    Health Coping  discuss diabetes with (comment) MD, RD, CDE      Patient Self-Evaluation of Goals - Patient rates self as meeting previously set goals (% of time)   Nutrition  50 - 75 %    Physical Activity  Not Applicable    Medications  50 - 75 %    Monitoring  >75%    Problem Solving  50 - 75 %    Reducing Risk  50 - 75 %    Health Coping  50 - 75 %      Post-Education Assessment   Patient understands the diabetes disease and treatment process.  Needs Instruction    Patient understands incorporating nutritional management into lifestyle.  Needs Review    Patient undertands incorporating physical activity into lifestyle.  Needs Instruction    Patient understands using medications safely.  Needs Review    Patient understands monitoring blood glucose, interpreting and using results   Demonstrates understanding / competency    Patient understands prevention, detection, and treatment of acute complications.  Needs Instruction    Patient understands prevention, detection, and treatment of chronic complications.  Needs Instruction    Patient understands how to develop strategies to address psychosocial issues.  Needs Review    Patient understands how to develop strategies to promote health/change behavior.  Needs Review      Outcomes   Expected Outcomes  Other (comment) demonstrated interest in learning but barriers    Future DMSE  4-6 wks    Program Status  Completed      Subsequent Visit   Since  your last visit have you continued or begun to take your medications as prescribed?  Yes    Since your last visit have you had your blood pressure checked?  Yes    Since your last visit have you experienced any weight changes?  Gain    Weight Gain (lbs)  7    Since your last visit, are you checking your blood glucose at least once a day?  Yes       Individualized Plan for Diabetes Self-Management Training:   Learning Objective:  Patient will have a greater understanding of diabetes self-management. Patient education plan is to attend individual and/or group sessions per assessed needs and concerns.   Plan:   Patient Instructions  Other Food Resources:  [facility]- 4th Tuesday of each month from 2:30-4   [address]  [facility]- 2nd Sunday of each month from 2:30-4   [address] [facility]  [employer] (may have resources)   [phone] [employer]   [phone]  See food pantry list and free meal site.    Insulin instructions per Dr. Remus Blake July note:  Increase your insulin dose before breakfast to 24 units  Increase your insulin dose before supper to 18 units.  Plan:  Aim for 4-5 Carb Choices per meal (60-75 grams) +/- 1 either way  Aim for 0-2 Carbs per snack if hungry  Include protein in moderation  with your meals and snacks Consider reading food labels for Total Carbohydrate and Fat Grams of foods Consider  increasing your activity level by walking 15-30 for minutes daily as tolerated Consider checking BG at alternate times per day as directed by MD  Consider taking medication as directed by MD  Your next appointment with me is   September 3 at 11:30 after Dr. Remus Blake appointment.  Please go to [address] in the same building.   Expected Outcomes:  Other (comment)(demonstrated interest in learning but barriers)  Education material provided: ADA Diabetes: Your Take Control Guide, Food label handouts, Meal plan card, My Plate and Snack sheet, food insecurity sheet, free meal sites  If problems or questions, patient to contact team via:  Phone  Future DSME appointment: 4-6 wksDiabetes Self-Management

## 2017-10-01 ENCOUNTER — Other Ambulatory Visit (INDEPENDENT_AMBULATORY_CARE_PROVIDER_SITE_OTHER): Payer: Medicaid Other

## 2017-10-01 ENCOUNTER — Other Ambulatory Visit: Payer: Self-pay

## 2017-10-01 ENCOUNTER — Encounter: Payer: Self-pay | Admitting: Neurology

## 2017-10-01 ENCOUNTER — Ambulatory Visit (INDEPENDENT_AMBULATORY_CARE_PROVIDER_SITE_OTHER): Payer: Medicaid Other | Admitting: Neurology

## 2017-10-01 VITALS — BP 110/86 | HR 97 | Ht 74.0 in | Wt 188.0 lb

## 2017-10-01 DIAGNOSIS — G40009 Localization-related (focal) (partial) idiopathic epilepsy and epileptic syndromes with seizures of localized onset, not intractable, without status epilepticus: Secondary | ICD-10-CM | POA: Diagnosis not present

## 2017-10-01 DIAGNOSIS — E1165 Type 2 diabetes mellitus with hyperglycemia: Secondary | ICD-10-CM | POA: Diagnosis not present

## 2017-10-01 DIAGNOSIS — Z794 Long term (current) use of insulin: Secondary | ICD-10-CM

## 2017-10-01 LAB — GLUCOSE, RANDOM: Glucose, Bld: 315 mg/dL — ABNORMAL HIGH (ref 70–99)

## 2017-10-01 LAB — HEMOGLOBIN A1C: Hgb A1c MFr Bld: 10.5 % — ABNORMAL HIGH (ref 4.6–6.5)
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
## 2017-10-05 DIAGNOSIS — Z794 Long term (current) use of insulin: Secondary | ICD-10-CM

## 2017-10-05 DIAGNOSIS — E1165 Type 2 diabetes mellitus with hyperglycemia: Secondary | ICD-10-CM | POA: Insufficient documentation

## 2017-10-05 NOTE — Progress Notes (Deleted)
Patient ID: Donald Simmons, male   DOB: [birth year], 42 y.o.   MRN: [id number]          Reason for Appointment: Consultation for  Diabetes  Referring physician: Karel Jarvis   History of Present Illness:          Date of diagnosis of type 2 diabetes mellitus: 2013       Background history:   At the time of diagnosis his blood sugar was 350 and he had symptoms of high sugar He was started on metformin and continued on this He thinks that metformin would tend to cause occasional diarrhea and he does better with the extended release No detailed records are available but he has been irregular with his management because of affordability and insurance issues   Recent history:       His A1c in 5/19 is 11.3, no previous comparison available  Non-insulin hypoglycemic drugs the patient is taking are: None  INSULIN: NovoLog Mix 70/30, 14 units before breakfast and 10 units before dinner   Current management, blood sugar patterns and problems identified:  He has not been able to use the Accu-Chek meter that was prescribed and is using a generic meter and checking only sporadically  He was supposed to continue increasing his insulin as instructed on the last visit but he has not done so  Also has not seen the dietitian as yet  He was previously on metformin but because of GI side effects this has been stopped  He thinks he gets excessively hungry in the evening after evening meal but this only since starting insulin  No unusual fatigue  His lab glucose was markedly increased at 356 this in the afternoon        Side effects from medications have been:?  Constipation and diarrhea from metformin  Compliance with the medical regimen: Poor Hypoglycemia: Never   Glucose monitoring:  Rarely done           Glucometer: Embrace  Recent fasting range 234-298, blood sugars mostly tested in June  Self-care: The diet that the patient has been following is: tries to limit meats.       Typical meal intake: Breakfast may be fruit smoothies, some meals may be fast food               Dietician visit, most recent: Never CDE visit: 06/2017               Exercise: none   Weight history: 180-190  Wt Readings from Last 3 Encounters:  10/01/17 188 lb (85.3 kg)  09/03/17 199 lb (90.3 kg)  08/12/17 195 lb 6.4 oz (88.6 kg)    Glycemic control:   Lab Results  Component Value Date   HGBA1C 10.5 (H) 10/01/2017   HGBA1C 11.3 06/17/2017   HGBA1C 9.9 (H) 02/07/2015   Lab Results  Component Value Date   LDLCALC 93 02/07/2015   CREATININE 0.75 08/10/2017   Lab Results  Component Value Date   MICRALBCREAT 4.5 10/25/2014    Lab Results  Component Value Date   FRUCTOSAMINE 414 (H) 08/10/2017      Allergies as of 10/06/2017      Reactions   Asa [aspirin]    Asa [aspirin] Hives      Medication List        Accurate as of 10/05/17  9:04 PM. Always use your most recent med list.          ACCU-CHEK AVIVA device Use as instructed  docusate sodium 100 MG capsule Commonly known as:  COLACE Take 100 mg by mouth daily as needed.   Eslicarbazepine Acetate 400 MG Tabs Take 1 tablet every night   glucose blood test strip Use as instructed to check blood sugar three times daily.   insulin aspart protamine - aspart (70-30) 100 UNIT/ML FlexPen Commonly known as:  NOVOLOG 70/30 MIX Take 14 units under the skin before breakfast and 10 units before dinner. Increase as directed.   Insulin Pen Needle 31G X 5 MM Misc Use with insulin pen twice a day   lamoTRIgine 100 MG tablet Commonly known as:  LAMICTAL Take 2 tablets twice a day   Lancets 28G Misc 1 each by Does not apply route daily. Use to check blood sugar three times daily.   nortriptyline 10 MG capsule Commonly known as:  PAMELOR Take 2 caps at night       Allergies:  Allergies  Allergen Reactions  . Asa [Aspirin]   . Asa [Aspirin] Hives    Past Medical History:  Diagnosis Date  . Diabetes  (HCC)   . Diabetes mellitus without complication (HCC)   . Hyperlipemia   . Hyperlipemia     No past surgical history on file.  Family History  Problem Relation Age of Onset  . Diabetes Mother   . Thyroid disease Mother   . Hyperlipidemia Mother   . Seizures Maternal Uncle   . Diabetes Maternal Uncle     Social History:  reports that he has never smoked. He has never used smokeless tobacco. He reports that he drinks alcohol. He reports that he does not use drugs.   Review of Systems   Lipid history: He has been started on simvastatin a couple of years ago by previous physician, not taking    Lab Results  Component Value Date   CHOL 186 08/10/2017   HDL 36.50 (L) 08/10/2017   LDLCALC 93 02/07/2015   LDLDIRECT 106.0 08/10/2017   TRIG 351.0 (H) 08/10/2017   CHOLHDL 5 08/10/2017           Hypertension: Not present  BP Readings from Last 3 Encounters:  10/01/17 110/86  08/12/17 122/76  07/23/17 110/72    Most recent eye exam was years ago  Most recent foot exam: 5/19  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 Pains in his lower legs present, some tingling and burning night     Physical Examination:  There were no vitals taken for this visit.   ASSESSMENT:  Diabetes type 2, uncontrolled, nonobese  He has been on premixed insulin since 5/19  His blood sugars are monitored very infrequently and he has not followed up or adjusted his insulin as directed on his last visit in detail  Blood sugars are running at least 200 at home and checking blood sugars mostly in the morning Does not have any formal meal plan Also had little understanding about insulin   PLAN:    Again discussed in detail the adjustment of his insulin to keep blood sugars at least under 150 before meals  He will adjust his evening insulin based on morning sugars and morning insulin based on afternoon sugars every 3 to 4 days  Discussed blood sugar  targets before and after meals  Reassured him that insulin does not cause side effects that he is asking about  For now he will go up to 22 units in the morning and 16 before supper  Although complaining of excessive hunger  and not getting satiety with his evening meal not clear if this is related to insulin alone  Since he is insulin deficient he will need to continue adjust insulin but consider using separate basal bolus insulin  For now he needs considerable diabetes education including meal planning which will be scheduled  He will start using the Accu-Chek meter that has been prescribed and prescription for strips and lancets were sent  NEUROPATHY: Needs to discuss treatment with his neurologist   There are no Patient Instructions on file for this visit. Counseling time on subjects discussed in assessment and plan sections is over 50% of today's 25 minute visit       Reather Littler 10/05/2017, 9:04 PM   Note: This office note was prepared with Dragon voice recognition system technology. Any transcriptional errors that result from this process are unintentional.

## 2017-10-06 ENCOUNTER — Ambulatory Visit: Payer: Medicaid Other | Admitting: Dietician

## 2017-10-06 ENCOUNTER — Ambulatory Visit: Payer: Medicaid Other | Admitting: Endocrinology

## 2017-10-06 DIAGNOSIS — Z0289 Encounter for other administrative examinations: Secondary | ICD-10-CM

## 2017-10-14 ENCOUNTER — Telehealth: Payer: Self-pay | Admitting: Neurology

## 2017-10-14 NOTE — Telephone Encounter (Signed)
Patient's wife was calling to check on the FMLA paperwork from [employer] for [employer]. She wanted to know if they have sent that information in and if it was completed. Her number is [phone]. if you can't reach her then you can call him at [phone]. Thanks.

## 2017-10-14 NOTE — Telephone Encounter (Signed)
Spoke with pt's wife, Milagros Loll, advising her that I have not received any FMLA paperwork from her employer.  Milagros Loll states that she will speak with her HR department and have them fax it to me.

## 2017-10-27 ENCOUNTER — Telehealth: Payer: Self-pay | Admitting: Neurology

## 2017-10-27 NOTE — Telephone Encounter (Signed)
Patient called and needs to check the status of his Paperwork. Please Call.

## 2017-10-28 NOTE — Telephone Encounter (Signed)
Patient's wife is calling in again trying to make sure we got all the FMLA paperwork for her husband. Please call her back at [phone]. Thanks!

## 2017-10-29 NOTE — Telephone Encounter (Signed)
Returned call.  Advised that we still have not received FMLA paperwork.  Relayed both of our fax numbers to [place].

## 2017-11-09 ENCOUNTER — Ambulatory Visit: Payer: Medicaid Other | Admitting: Dietician

## 2017-11-19 ENCOUNTER — Telehealth: Payer: Self-pay | Admitting: Endocrinology

## 2017-11-19 NOTE — Telephone Encounter (Signed)
Medical Clearance form was dropped off to be signed by Dr. Lucianne Muss. Patient is having Periodontal Surgery on 11/24/17. Form is in Dr. Remus Blake incoming basket at front.

## 2017-11-20 NOTE — Telephone Encounter (Signed)
I do not have this form?

## 2017-11-20 NOTE — Telephone Encounter (Signed)
Has this been signed?

## 2017-11-23 ENCOUNTER — Telehealth: Payer: Self-pay | Admitting: Endocrinology

## 2017-11-23 NOTE — Telephone Encounter (Signed)
FYI

## 2017-11-23 NOTE — Telephone Encounter (Signed)
Documents sent to Dr. Kelby Aline review and sign today.

## 2017-11-23 NOTE — Telephone Encounter (Signed)
please call patient and periodontist: Last a1c was too high for pt to be cleared.  I would be happy to see pt his week tto address

## 2017-11-23 NOTE — Telephone Encounter (Signed)
Pts wife dropped off paperwork for pts surgery that is taking place tomorrow  They would like a status update on this??

## 2017-11-23 NOTE — Telephone Encounter (Signed)
Tried call 4-5 times today---and no VM setting to leave the message.

## 2017-11-25 ENCOUNTER — Telehealth: Payer: Self-pay | Admitting: Endocrinology

## 2017-11-25 NOTE — Telephone Encounter (Signed)
Hi Pam, This is for medical record release. Can you all assist please

## 2017-11-25 NOTE — Telephone Encounter (Signed)
Donald Simmons is wanting labs and office notes of last visit. Stated patient brought medical release down last week. Please Advise. Fax # [phone]

## 2017-12-02 ENCOUNTER — Other Ambulatory Visit: Payer: Self-pay

## 2017-12-02 ENCOUNTER — Encounter: Payer: Self-pay | Admitting: Neurology

## 2017-12-02 ENCOUNTER — Ambulatory Visit (INDEPENDENT_AMBULATORY_CARE_PROVIDER_SITE_OTHER): Payer: Medicaid Other | Admitting: Neurology

## 2017-12-02 VITALS — BP 114/88 | HR 108 | Ht 74.0 in | Wt 202.0 lb

## 2017-12-02 DIAGNOSIS — G40009 Localization-related (focal) (partial) idiopathic epilepsy and epileptic syndromes with seizures of localized onset, not intractable, without status epilepticus: Secondary | ICD-10-CM

## 2017-12-02 MED ORDER — NORTRIPTYLINE HCL 10 MG PO CAPS: ORAL_CAPSULE | ORAL | 11 refills | Status: DC

## 2017-12-02 MED ORDER — ESLICARBAZEPINE ACETATE 400 MG PO TABS: ORAL_TABLET | ORAL | 11 refills | Status: DC

## 2017-12-02 MED ORDER — LAMOTRIGINE 100 MG PO TABS: ORAL_TABLET | ORAL | 11 refills | Status: DC
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
## 2017-12-07 ENCOUNTER — Encounter: Payer: Self-pay | Admitting: Neurology

## 2017-12-17 ENCOUNTER — Ambulatory Visit: Payer: Medicaid Other | Admitting: Endocrinology

## 2018-01-20 DIAGNOSIS — E1140 Type 2 diabetes mellitus with diabetic neuropathy, unspecified: Secondary | ICD-10-CM | POA: Insufficient documentation

## 2018-01-20 DIAGNOSIS — M542 Cervicalgia: Secondary | ICD-10-CM | POA: Insufficient documentation

## 2018-01-20 DIAGNOSIS — R5383 Other fatigue: Secondary | ICD-10-CM | POA: Insufficient documentation

## 2018-01-20 DIAGNOSIS — M545 Low back pain, unspecified: Secondary | ICD-10-CM | POA: Insufficient documentation

## 2018-01-20 DIAGNOSIS — IMO0002 Reserved for concepts with insufficient information to code with codable children: Secondary | ICD-10-CM | POA: Insufficient documentation

## 2018-01-20 DIAGNOSIS — G8929 Other chronic pain: Secondary | ICD-10-CM | POA: Insufficient documentation

## 2018-02-04 ENCOUNTER — Other Ambulatory Visit (INDEPENDENT_AMBULATORY_CARE_PROVIDER_SITE_OTHER): Payer: Medicaid Other

## 2018-02-04 ENCOUNTER — Ambulatory Visit: Payer: Medicaid Other | Admitting: Endocrinology

## 2018-02-04 ENCOUNTER — Other Ambulatory Visit: Payer: Self-pay | Admitting: Endocrinology

## 2018-02-04 DIAGNOSIS — Z794 Long term (current) use of insulin: Secondary | ICD-10-CM

## 2018-02-04 DIAGNOSIS — E1165 Type 2 diabetes mellitus with hyperglycemia: Secondary | ICD-10-CM

## 2018-02-04 NOTE — Progress Notes (Deleted)
error 

## 2018-02-26 ENCOUNTER — Other Ambulatory Visit: Payer: Medicaid Other

## 2018-02-26 ENCOUNTER — Ambulatory Visit: Payer: Medicaid Other | Admitting: Endocrinology

## 2018-02-26 ENCOUNTER — Ambulatory Visit: Payer: Medicaid Other | Admitting: Neurology

## 2018-03-01 ENCOUNTER — Other Ambulatory Visit: Payer: Self-pay | Admitting: Endocrinology

## 2018-03-01 DIAGNOSIS — E1165 Type 2 diabetes mellitus with hyperglycemia: Secondary | ICD-10-CM

## 2018-03-10 ENCOUNTER — Ambulatory Visit: Payer: Medicaid Other | Admitting: Neurology

## 2018-03-10 ENCOUNTER — Encounter: Payer: Self-pay | Admitting: Neurology

## 2018-03-10 ENCOUNTER — Ambulatory Visit: Payer: Medicaid Other | Admitting: Endocrinology

## 2018-03-10 ENCOUNTER — Other Ambulatory Visit: Payer: Medicaid Other

## 2018-03-10 VITALS — BP 122/78 | HR 96 | Ht 74.0 in | Wt 196.0 lb

## 2018-03-10 DIAGNOSIS — F329 Major depressive disorder, single episode, unspecified: Secondary | ICD-10-CM | POA: Diagnosis not present

## 2018-03-10 DIAGNOSIS — G40019 Localization-related (focal) (partial) idiopathic epilepsy and epileptic syndromes with seizures of localized onset, intractable, without status epilepticus: Secondary | ICD-10-CM

## 2018-03-10 DIAGNOSIS — F32A Depression, unspecified: Secondary | ICD-10-CM

## 2018-03-10 MED ORDER — ESLICARBAZEPINE ACETATE 400 MG PO TABS: ORAL_TABLET | ORAL | 11 refills | Status: DC

## 2018-03-10 MED ORDER — NORTRIPTYLINE HCL 10 MG PO CAPS: ORAL_CAPSULE | ORAL | 11 refills | Status: DC

## 2018-03-10 MED ORDER — LAMOTRIGINE 100 MG PO TABS: ORAL_TABLET | ORAL | 11 refills | Status: DC
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
## 2018-03-16 ENCOUNTER — Ambulatory Visit: Payer: Medicaid Other | Admitting: Endocrinology

## 2018-03-30 ENCOUNTER — Telehealth: Payer: Self-pay | Admitting: Endocrinology

## 2018-03-30 NOTE — Telephone Encounter (Signed)
LMTCB for patient to resched missed labs/appointment, per Dr Lucianne Muss if he does not resched is is to be considered self cancelled

## 2018-04-07 ENCOUNTER — Encounter: Payer: Self-pay | Admitting: Endocrinology

## 2018-04-16 ENCOUNTER — Telehealth: Payer: Self-pay | Admitting: Neurology

## 2018-04-16 NOTE — Telephone Encounter (Signed)
Calling regarding FMLA paperwork. Please Call. Thanks

## 2018-04-21 NOTE — Telephone Encounter (Signed)
Please Call  Patient at [phone] His Cell. He is checking on his FMLA paperwork status. Thanks

## 2018-04-22 NOTE — Telephone Encounter (Signed)
Pt's wife aware that we have not received FMLA paperwork

## 2018-04-26 ENCOUNTER — Telehealth: Payer: Self-pay | Admitting: Neurology

## 2018-05-05 ENCOUNTER — Telehealth: Payer: Self-pay | Admitting: Neurology

## 2018-05-05 NOTE — Telephone Encounter (Signed)
[facility] calling wanting office notes from Feb appt. Please fax this to [phone]. Thnaks!

## 2018-05-06 NOTE — Telephone Encounter (Signed)
Notes faxed to number provided below.

## 2018-09-06 ENCOUNTER — Telehealth: Payer: Self-pay | Admitting: Neurology

## 2018-09-06 NOTE — Telephone Encounter (Signed)
Wife is calling in about FMLA paperwork update. Thanks!

## 2018-09-07 NOTE — Telephone Encounter (Signed)
Spoke with pts wife. She states that her employer needs new forms. Informed her that we do not have not received the forms. She will contact her employer and have them faxed.

## 2018-09-13 ENCOUNTER — Telehealth: Payer: Self-pay | Admitting: Neurology

## 2018-09-13 NOTE — Telephone Encounter (Signed)
FMLA Forms are complete.  Left message for pt to return call and to let us know where forms need to be sent.

## 2018-09-13 NOTE — Telephone Encounter (Signed)
Left message with after hor service on 09-13-18 @ 12:19 pm   Caller states they are wanting to make sure we received the FMLA paper work

## 2018-09-16 ENCOUNTER — Telehealth: Payer: Self-pay | Admitting: Neurology

## 2018-09-16 NOTE — Telephone Encounter (Signed)
Forms completed by Dr. Delice Lesch on 09/13/18. Tried calling pt/wife multiple times with no answer.  At one point wife did answer and she kept putting me on hold. I hung the phone up and faxed the forms to [facility] and saved one copy to be scanned and one to be filed in the saved faxes folder.   The originals have been placed up front in case the pt/wife want to come by the office and pick up.

## 2018-09-16 NOTE — Telephone Encounter (Signed)
Left msg with after hours about FMLA paperwork needing to be done. She asked for a call back with an update on this. Thanks!

## 2018-10-02 ENCOUNTER — Other Ambulatory Visit: Payer: Self-pay | Admitting: Endocrinology

## 2018-10-04 ENCOUNTER — Other Ambulatory Visit: Payer: Self-pay

## 2018-10-04 MED ORDER — NOVOLOG MIX 70/30 FLEXPEN (70-30) 100 UNIT/ML ~~LOC~~ SUPN: PEN_INJECTOR | SUBCUTANEOUS | 0 refills | Status: DC

## 2018-10-15 ENCOUNTER — Encounter: Payer: Self-pay | Admitting: Neurology

## 2018-10-20 NOTE — Telephone Encounter (Signed)
error 

## 2018-10-22 ENCOUNTER — Encounter: Payer: Self-pay | Admitting: Endocrinology

## 2018-10-27 ENCOUNTER — Telehealth: Payer: Self-pay | Admitting: Endocrinology

## 2018-10-27 NOTE — Telephone Encounter (Signed)
Patient dismissed from [facility] by Dr. Clydene Pugh, effective 10/22/2018. Dismissal letter was sent by registered mail. js

## 2018-11-11 ENCOUNTER — Telehealth (INDEPENDENT_AMBULATORY_CARE_PROVIDER_SITE_OTHER): Payer: Medicaid Other | Admitting: Neurology

## 2018-11-11 ENCOUNTER — Other Ambulatory Visit: Payer: Self-pay

## 2018-11-11 ENCOUNTER — Encounter: Payer: Self-pay | Admitting: Neurology

## 2018-11-11 VITALS — Ht 74.0 in | Wt 180.0 lb

## 2018-11-11 DIAGNOSIS — G40019 Localization-related (focal) (partial) idiopathic epilepsy and epileptic syndromes with seizures of localized onset, intractable, without status epilepticus: Secondary | ICD-10-CM

## 2018-11-11 DIAGNOSIS — F329 Major depressive disorder, single episode, unspecified: Secondary | ICD-10-CM

## 2018-11-11 DIAGNOSIS — F32A Depression, unspecified: Secondary | ICD-10-CM

## 2018-11-11 MED ORDER — NORTRIPTYLINE HCL 10 MG PO CAPS: ORAL_CAPSULE | ORAL | 11 refills | Status: DC

## 2018-11-11 MED ORDER — APTIOM 400 MG PO TABS: ORAL_TABLET | ORAL | 11 refills | Status: DC

## 2018-11-11 MED ORDER — LAMOTRIGINE 100 MG PO TABS: ORAL_TABLET | ORAL | 11 refills | Status: DC
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
## 2018-11-17 DIAGNOSIS — E1169 Type 2 diabetes mellitus with other specified complication: Secondary | ICD-10-CM | POA: Insufficient documentation

## 2019-06-02 ENCOUNTER — Telehealth: Payer: Self-pay | Admitting: Neurology

## 2019-06-02 NOTE — Telephone Encounter (Signed)
Will ask Heather on Friday

## 2019-06-02 NOTE — Telephone Encounter (Signed)
Patient called to see if Dr. Karel Jarvis has received his FMLA paperwork by fax yet.

## 2019-06-03 NOTE — Telephone Encounter (Signed)
I do not have any paperwork for him

## 2019-06-03 NOTE — Telephone Encounter (Signed)
Pt called informed we DO NOT have his FMLA paper work he stated that he will call and find out what is going on with it.

## 2019-06-06 ENCOUNTER — Ambulatory Visit: Payer: Medicaid Other | Admitting: Neurology

## 2019-06-07 ENCOUNTER — Other Ambulatory Visit: Payer: Self-pay

## 2019-06-07 ENCOUNTER — Encounter: Payer: Self-pay | Admitting: Neurology

## 2019-06-07 ENCOUNTER — Telehealth (INDEPENDENT_AMBULATORY_CARE_PROVIDER_SITE_OTHER): Payer: Medicaid Other | Admitting: Neurology

## 2019-06-07 VITALS — Ht 73.0 in | Wt 175.0 lb

## 2019-06-07 DIAGNOSIS — F329 Major depressive disorder, single episode, unspecified: Secondary | ICD-10-CM

## 2019-06-07 DIAGNOSIS — F32A Depression, unspecified: Secondary | ICD-10-CM

## 2019-06-07 DIAGNOSIS — G40019 Localization-related (focal) (partial) idiopathic epilepsy and epileptic syndromes with seizures of localized onset, intractable, without status epilepticus: Secondary | ICD-10-CM | POA: Diagnosis not present

## 2019-06-07 MED ORDER — APTIOM 400 MG PO TABS: ORAL_TABLET | ORAL | 11 refills | Status: DC

## 2019-06-07 MED ORDER — LAMOTRIGINE 100 MG PO TABS: ORAL_TABLET | ORAL | 11 refills | Status: DC

## 2019-06-07 MED ORDER — NORTRIPTYLINE HCL 10 MG PO CAPS: ORAL_CAPSULE | ORAL | 3 refills | Status: DC
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
## 2019-06-30 ENCOUNTER — Ambulatory Visit: Payer: Medicaid Other | Attending: Sports Medicine | Admitting: Physical Therapy

## 2019-06-30 ENCOUNTER — Other Ambulatory Visit: Payer: Self-pay

## 2019-06-30 ENCOUNTER — Encounter: Payer: Self-pay | Admitting: Physical Therapy

## 2019-06-30 DIAGNOSIS — M25611 Stiffness of right shoulder, not elsewhere classified: Secondary | ICD-10-CM | POA: Diagnosis present

## 2019-06-30 DIAGNOSIS — M25511 Pain in right shoulder: Secondary | ICD-10-CM | POA: Diagnosis not present

## 2019-06-30 NOTE — Therapy (Signed)
[facility] [address] [place], [place], [address] Phone: [phone]   Fax:  [phone]  Physical Therapy Evaluation  Patient Details  Name: Donald Simmons MRN: [id number] Date of Birth: 1975/07/08 Referring Provider (PT): Brumfield   Encounter Date: 06/30/2019  PT End of Session - 06/30/19 1130    Visit Number  1    Number of Visits  4    Date for PT Re-Evaluation  07/31/19    Authorization Type  Medicaid    PT Start Time  0810    PT Stop Time  0842    PT Time Calculation (min)  32 min    Activity Tolerance  Patient limited by pain    Behavior During Therapy  [facility] for tasks assessed/performed       Past Medical History:  Diagnosis Date  . Diabetes (HCC)   . Diabetes mellitus without complication (HCC)   . Hyperlipemia   . Hyperlipemia     History reviewed. No pertinent surgical history.  There were no vitals filed for this visit.   Subjective Assessment - 06/30/19 0813    Subjective  Patient reports that in November he woke up and felt like he slept on the shoulder wrong, he reports that he slowly noticed that he did not have ROM.  Reports difficulty with using the right arm, he is right handed    Limitations  House hold activities    Patient Stated Goals  have better ROM and no pain    Currently in Pain?  Yes    Pain Score  1     Pain Location  Shoulder    Pain Orientation  Right    Pain Descriptors / Indicators  Aching;Sharp;Tightness    Pain Type  Acute pain    Pain Radiating Towards  reports sometimes some numbness in the right hand    Pain Onset  More than a month ago    Pain Frequency  Constant    Aggravating Factors   reaching out pain can be up to 10/10 reports that he is really having diffiuclty with reaching    Pain Relieving Factors  pillow under the arm, slow motions pain can be 1/10    Effect of Pain on Daily Activities  limits everything         [facility] PT Assessment - 06/30/19  0001      Assessment   Medical Diagnosis  right frozen shoulder    Referring Provider (PT)  Brumfield    Onset Date/Surgical Date  05/31/19    Hand Dominance  Right      Precautions   Precautions  None      Balance Screen   Has the patient fallen in the past 6 months  No    Has the patient had a decrease in activity level because of a fear of falling?   No    Is the patient reluctant to leave their home because of a fear of falling?   No      Home Environment   Additional Comments  reports some light housework and has a 42 year old      Prior Function   Level of Independence  Independent    Vocation  Unemployed;On disability    Vocation Requirements  epilepsy    Leisure  normally does some light stretching for exercise      Posture/Postural Control   Posture Comments  fwd head, rounded shoulders  ROM / Strength   AROM / PROM / Strength  AROM;PROM;Strength      AROM   AROM Assessment Site  Shoulder    Right/Left Shoulder  Right    Right Shoulder Flexion  50 Degrees    Right Shoulder ABduction  20 Degrees    Right Shoulder Internal Rotation  20 Degrees    Right Shoulder External Rotation  5 Degrees      PROM   PROM Assessment Site  Shoulder    Right/Left Shoulder  Right    Right Shoulder Flexion  80 Degrees    Right Shoulder ABduction  33 Degrees    Right Shoulder Internal Rotation  25 Degrees    Right Shoulder External Rotation  8 Degrees      Strength   Overall Strength Comments  very weak, with c/o pain really could not give much resistance       Palpation   Palpation comment  he is tender in the right lateral shoulder, non tender elsewhere                  Objective measurements completed on examination: See above findings.                   PT Long Term Goals - 06/30/19 1134      PT LONG TERM GOAL #1   Title  "Pt will be independent with advanced HEP.     Time  12    Period  Weeks    Status  New      PT LONG TERM GOAL  #2   Title  "Pain will decrease to 3/10or less with all functional activities    Time  12    Period  Weeks    Status  New      PT LONG TERM GOAL #3   Title  increase AROM of the right shoulder to 120 degrees flexion    Time  12    Period  Weeks    Status  New      PT LONG TERM GOAL #4   Title  increase right shoulder AROM to 60 degrees ER    Time  12    Period  Weeks    Status  New      PT LONG TERM GOAL #5   Title  increase right shoulder IR to 55 degrees    Time  12    Period  Weeks    Status  New             Plan - 06/30/19 1130    Clinical Impression Statement  Patient presents with a dx of right frozen shoulder, he is right handed.  He is unsure of a cause but reports incresed pain and difficulty using over the past 4 months.  His ROM is severely limited, he has difficulty with any reaching and reports pain up to 10/10.  He has mild tenderness laterally, reports some numbness in the right hand at times.    Stability/Clinical Decision Making  Stable/Uncomplicated    Clinical Decision Making  Low    Rehab Potential  Fair    PT Frequency  1x / week    PT Duration  12 weeks    PT Treatment/Interventions  ADLs/Self Care Home Management;Electrical Stimulation;Cryotherapy;Iontophoresis 4mg /ml Dexamethasone;Moist Heat;Traction;Ultrasound;Therapeutic exercise;Therapeutic activities;Patient/family education;Manual techniques;Passive range of motion    PT Next Visit Plan  gave HEP today, see how that is going and start him moving in the gym  Consulted and Agree with Plan of Care  Patient       Patient will benefit from skilled therapeutic intervention in order to improve the following deficits and impairments:  Decreased range of motion, Increased muscle spasms, Pain, Improper body mechanics, Impaired flexibility, Impaired UE functional use, Decreased strength, Postural dysfunction  Visit Diagnosis: Acute pain of right shoulder - Plan: PT plan of care  cert/re-cert  Stiffness of right shoulder, not elsewhere classified - Plan: PT plan of care cert/re-cert     Problem List Patient Active Problem List   Diagnosis Date Noted  . Hyperlipidemia associated with type 2 diabetes mellitus ([place]) 11/17/2018  . Chronic bilateral low back pain without sciatica 01/20/2018  . Chronic left shoulder pain 01/20/2018  . Fatigue 01/20/2018  . Neck pain 01/20/2018  . Uncontrolled type 2 diabetes mellitus with diabetic neuropathy, with long-term current use of insulin ([place]) 01/20/2018  . Uncontrolled type 2 diabetes mellitus with hyperglycemia, with long-term current use of insulin ([place]) 10/05/2017  . Neuropathy 06/09/2016  . Localization-related idiopathic epilepsy and epileptic syndromes with seizures of localized onset, not intractable, without status epilepticus ([place]) 02/14/2015    Sumner Boast., PT 06/30/2019, 5:02 PM  [place], [place], [address] Phone: [phone]   Fax:  [phone]  Name: Donald Simmons MRN: [id number] Date of Birth: 06-17-1975

## 2019-06-30 NOTE — Patient Instructions (Signed)
Access Code: [email or web address] URL: [email or web address] Date: 06/30/2019 Prepared by: Stacie Glaze  Exercises Supine Shoulder Flexion with Dowel - 3 x daily - 7 x weekly - 1 sets - 10 reps - 10 hold Seated Shoulder Flexion Towel Slide at Table Top - 3 x daily - 7 x weekly - 1 sets - 10 reps - 10 hold Seated Shoulder External Rotation PROM on Table - 3 x daily - 7 x weekly - 1 sets - 10 reps - 10 hold Seated Elbow Flexion Shoulder Internal Rotation AAROM at Table with Towel - 3 x daily - 7 x weekly - 1 sets - 10 reps - 10 hold Standing Shoulder Internal Rotation Stretch with Towel - 3 x daily - 7 x weekly - 1 sets - 10 reps - 10 hold

## 2019-07-12 ENCOUNTER — Ambulatory Visit: Payer: Medicaid Other | Admitting: Physical Therapy

## 2019-07-18 ENCOUNTER — Ambulatory Visit: Payer: Medicaid Other | Attending: Sports Medicine | Admitting: Physical Therapy

## 2019-07-18 ENCOUNTER — Encounter: Payer: Self-pay | Admitting: Physical Therapy

## 2019-07-18 ENCOUNTER — Other Ambulatory Visit: Payer: Self-pay

## 2019-07-18 DIAGNOSIS — M25511 Pain in right shoulder: Secondary | ICD-10-CM | POA: Insufficient documentation

## 2019-07-18 DIAGNOSIS — M25611 Stiffness of right shoulder, not elsewhere classified: Secondary | ICD-10-CM | POA: Diagnosis present

## 2019-07-18 NOTE — Therapy (Signed)
[facility] [place] [facility] [address] [place], [place], [address] Phone: [phone]   Fax:  [phone]  Physical Therapy Treatment  Patient Details  Name: Donald Simmons MRN: [id number] Date of Birth: [birth year] Referring Provider (PT): Brumfield   Encounter Date: 07/18/2019   PT End of Session - 07/18/19 1740    Visit Number 2    Number of Visits 4    Date for PT Re-Evaluation 07/31/19    Authorization Type Medicaid    PT Start Time 1700    PT Stop Time 1748    PT Time Calculation (min) 48 min    Activity Tolerance Patient limited by pain    Behavior During Therapy [facility] for tasks assessed/performed           Past Medical History:  Diagnosis Date  . Diabetes (HCC)   . Diabetes mellitus without complication (HCC)   . Hyperlipemia   . Hyperlipemia     History reviewed. No pertinent surgical history.  There were no vitals filed for this visit.   Subjective Assessment - 07/18/19 1705    Subjective Pt reports that today is a very bad day for shoulder pain; states he is doing his HEP but has difficulty stretching R shoulder without feeling it is "so painful I might pass out"    Currently in Pain? Yes    Pain Score 6     Pain Location Shoulder    Pain Orientation Right                             [facility] Adult PT Treatment/Exercise - 07/18/19 0001      Exercises   Exercises Shoulder      Shoulder Exercises: Supine   Flexion AAROM;5 reps    Flexion Limitations with dowel; very painful to ~90 deg      Shoulder Exercises: Pulleys   Flexion 1 minute    Flexion Limitations very limited by pain    Scaption 1 minute    Scaption Limitations very limited by pain    ABduction Limitations unable to move into abduction with pulley      Shoulder Exercises: ROM/Strengthening   UBE (Upper Arm Bike) L1 3 min fwd/3 min bkwd      Modalities   Modalities Moist Heat      Moist Heat Therapy   Number  Minutes Moist Heat 10 Minutes    Moist Heat Location Shoulder      Manual Therapy   Manual Therapy Soft tissue mobilization;Joint mobilization;Passive ROM    Manual therapy comments very painful and guarded with all motions    Joint Mobilization grade 1-2 for pain    Soft tissue mobilization to biceps, triceps, UT    Passive ROM all shld directions; very tight in IR/ER; painful in all directions                       PT Long Term Goals - 06/30/19 1134      PT LONG TERM GOAL #1   Title "Pt will be independent with advanced HEP.     Time 12    Period Weeks    Status New      PT LONG TERM GOAL #2   Title "Pain will decrease to 3/10or less with all functional activities    Time 12    Period Weeks    Status New  PT LONG TERM GOAL #3   Title increase AROM of the right shoulder to 120 degrees flexion    Time 12    Period Weeks    Status New      PT LONG TERM GOAL #4   Title increase right shoulder AROM to 60 degrees ER    Time 12    Period Weeks    Status New      PT LONG TERM GOAL #5   Title increase right shoulder IR to 55 degrees    Time 12    Period Weeks    Status New                 Plan - 07/18/19 1741    Clinical Impression Statement Pt was very painful/guarded with todays interventions. Able to slowly complete 6 min on UBE. Partial ROM flexion/abduction with dowels and pulley but very painful. Pt tolerated PROM but was very painful/guarded throughout; pt is tight in all directions especially IR/ER. Continue to progress ROM.    PT Treatment/Interventions ADLs/Self Care Home Management;Electrical Stimulation;Cryotherapy;Iontophoresis 4mg /ml Dexamethasone;Moist Heat;Traction;Ultrasound;Therapeutic exercise;Therapeutic activities;Patient/family education;Manual techniques;Passive range of motion    PT Next Visit Plan review HEP, get pt moving, PROM/AAROM    Consulted and Agree with Plan of Care Patient           Patient will benefit from  skilled therapeutic intervention in order to improve the following deficits and impairments:  Decreased range of motion, Increased muscle spasms, Pain, Improper body mechanics, Impaired flexibility, Impaired UE functional use, Decreased strength, Postural dysfunction  Visit Diagnosis: Acute pain of right shoulder  Stiffness of right shoulder, not elsewhere classified     Problem List Patient Active Problem List   Diagnosis Date Noted  . Hyperlipidemia associated with type 2 diabetes mellitus ([place]) 11/17/2018  . Chronic bilateral low back pain without sciatica 01/20/2018  . Chronic left shoulder pain 01/20/2018  . Fatigue 01/20/2018  . Neck pain 01/20/2018  . Uncontrolled type 2 diabetes mellitus with diabetic neuropathy, with long-term current use of insulin ([place]) 01/20/2018  . Uncontrolled type 2 diabetes mellitus with hyperglycemia, with long-term current use of insulin ([place]) 10/05/2017  . Neuropathy 06/09/2016  . Localization-related idiopathic epilepsy and epileptic syndromes with seizures of localized onset, not intractable, without status epilepticus ([place]) 02/14/2015   Amador Cunas, PT, DPT Donald Prose Jerzie Bieri 07/18/2019, 5:44 PM  [place], [place], [address] Phone: [phone]   Fax:  [phone]  Name: Donald Simmons MRN: [id number] Date of Birth: 01/05/1976

## 2019-07-19 ENCOUNTER — Ambulatory Visit: Payer: Medicaid Other | Admitting: Physical Therapy

## 2019-08-01 ENCOUNTER — Encounter: Payer: Self-pay | Admitting: Physical Therapy

## 2019-08-01 ENCOUNTER — Ambulatory Visit: Payer: Medicaid Other | Admitting: Physical Therapy

## 2019-08-01 ENCOUNTER — Other Ambulatory Visit: Payer: Self-pay

## 2019-08-01 DIAGNOSIS — M25611 Stiffness of right shoulder, not elsewhere classified: Secondary | ICD-10-CM

## 2019-08-01 DIAGNOSIS — M25511 Pain in right shoulder: Secondary | ICD-10-CM

## 2019-08-01 NOTE — Therapy (Signed)
[place], [place], [address] Phone: [phone]   Fax:  [phone]  Physical Therapy Treatment  Patient Details  Name: Donald Simmons MRN: [id number] Date of Birth: Apr 17, 1975 Referring Provider (PT): Brumfield   Encounter Date: 08/01/2019   PT End of Session - 08/01/19 1735    Visit Number 3    Number of Visits 4    Date for PT Re-Evaluation 07/31/19    PT Start Time 1700    PT Stop Time 1745    PT Time Calculation (min) 45 min    Activity Tolerance Patient limited by pain    Behavior During Therapy [facility] for tasks assessed/performed           Past Medical History:  Diagnosis Date  . Diabetes ([place])   . Diabetes mellitus without complication ([place])   . Hyperlipemia   . Hyperlipemia     History reviewed. No pertinent surgical history.  There were no vitals filed for this visit.   Subjective Assessment - 08/01/19 1706    Subjective Pt reports today is a bad day for shoulder; states that he has been doing his ex's.    Currently in Pain? Yes    Pain Score 7     Pain Location Shoulder    Pain Orientation Right              [facility] PT Assessment - 08/01/19 0001      PROM   Right Shoulder Flexion 90 Degrees    Right Shoulder ABduction 55 Degrees    Right Shoulder Internal Rotation 30 Degrees    Right Shoulder External Rotation 0 Degrees                         [facility] Adult PT Treatment/Exercise - 08/01/19 0001      Shoulder Exercises: Seated   Other Seated Exercises seated table slides flexion/abduction x3 very limited ROM and painful      Shoulder Exercises: Standing   Other Standing Exercises wall ladder flexion x3 only able to complete in partial ROM    Other Standing Exercises standing abduction with dowel partial ROM x10      Shoulder Exercises: ROM/Strengthening   UBE (Upper Arm Bike) L1 3 min fwd/3 min bkwd      Moist Heat Therapy   Number Minutes  Moist Heat 10 Minutes    Moist Heat Location Shoulder      Manual Therapy   Manual Therapy Soft tissue mobilization;Joint mobilization;Passive ROM    Manual therapy comments very painful and guarded with all motions    Joint Mobilization grade 1-2 for pain    Soft tissue mobilization to biceps, triceps, UT    Passive ROM all shld directions; very tight in IR/ER; painful in all directions                       PT Long Term Goals - 06/30/19 1134      PT LONG TERM GOAL #1   Title "Pt will be independent with advanced HEP.     Time 12    Period Weeks    Status New      PT LONG TERM GOAL #2   Title "Pain will decrease to 3/10or less with all functional activities    Time 12    Period Weeks    Status New      PT LONG TERM GOAL #  3   Title increase AROM of the right shoulder to 120 degrees flexion    Time 12    Period Weeks    Status New      PT LONG TERM GOAL #4   Title increase right shoulder AROM to 60 degrees ER    Time 12    Period Weeks    Status New      PT LONG TERM GOAL #5   Title increase right shoulder IR to 55 degrees    Time 12    Period Weeks    Status New                 Plan - 08/01/19 1735    Clinical Impression Statement Today's visit was out of range of medicaid authorization so no charge was submitted. Pt remains extremely painful/guarded with today's interventions. Able to slowly complete 6 min on UBE. Partial ROM and very painful/guarded with all AROM and AAROM ex's. Pt tolerated PROM but was very painful throughout. Tight in all directions esp ER and abduction.    PT Treatment/Interventions ADLs/Self Care Home Management;Electrical Stimulation;Cryotherapy;Iontophoresis 4mg /ml Dexamethasone;Moist Heat;Traction;Ultrasound;Therapeutic exercise;Therapeutic activities;Patient/family education;Manual techniques;Passive range of motion    PT Next Visit Plan review HEP, get pt moving, PROM/AAROM    Consulted and Agree with Plan of Care  Patient           Patient will benefit from skilled therapeutic intervention in order to improve the following deficits and impairments:  Decreased range of motion, Increased muscle spasms, Pain, Improper body mechanics, Impaired flexibility, Impaired UE functional use, Decreased strength, Postural dysfunction  Visit Diagnosis: Acute pain of right shoulder  Stiffness of right shoulder, not elsewhere classified     Problem List Patient Active Problem List   Diagnosis Date Noted  . Hyperlipidemia associated with type 2 diabetes mellitus (HCC) 11/17/2018  . Chronic bilateral low back pain without sciatica 01/20/2018  . Chronic left shoulder pain 01/20/2018  . Fatigue 01/20/2018  . Neck pain 01/20/2018  . Uncontrolled type 2 diabetes mellitus with diabetic neuropathy, with long-term current use of insulin (HCC) 01/20/2018  . Uncontrolled type 2 diabetes mellitus with hyperglycemia, with long-term current use of insulin (HCC) 10/05/2017  . Neuropathy 06/09/2016  . Localization-related idiopathic epilepsy and epileptic syndromes with seizures of localized onset, not intractable, without status epilepticus (HCC) 02/14/2015   04/14/2015, PT, DPT Lysle Rubens Onedia Vargus 08/01/2019, 5:39 PM  [facility] [place] [facility] [address] [place], [place], [place] Phone: [phone]   Fax:  [phone]  Name: Jacobus Colvin MRN: Lowella Fairy Date of Birth: [birth year]

## 2019-08-10 ENCOUNTER — Ambulatory Visit: Payer: Medicaid Other | Admitting: Physical Therapy

## 2019-08-17 ENCOUNTER — Encounter: Payer: Medicaid Other | Admitting: Physical Therapy

## 2019-08-23 ENCOUNTER — Other Ambulatory Visit: Payer: Self-pay

## 2019-08-23 ENCOUNTER — Encounter: Payer: Self-pay | Admitting: Physical Therapy

## 2019-08-23 ENCOUNTER — Ambulatory Visit: Payer: Medicaid Other | Attending: Sports Medicine | Admitting: Physical Therapy

## 2019-08-23 DIAGNOSIS — M25611 Stiffness of right shoulder, not elsewhere classified: Secondary | ICD-10-CM | POA: Insufficient documentation

## 2019-08-23 DIAGNOSIS — M25511 Pain in right shoulder: Secondary | ICD-10-CM | POA: Diagnosis not present

## 2019-08-23 NOTE — Therapy (Signed)
[facility] [place] [facility] [address] [place], [place], [address] Phone: [phone]   Fax:  [phone]  Physical Therapy Treatment  Patient Details  Name: Donald Simmons MRN: [id number] Date of Birth: 01-20-1976 Referring Provider (PT): Brumfield   Encounter Date: 08/23/2019   PT End of Session - 08/23/19 1736    Visit Number 4    Number of Visits 27    Authorization Type Medicaid    PT Start Time 1650    PT Stop Time 1745    PT Time Calculation (min) 55 min    Activity Tolerance Patient limited by pain    Behavior During Therapy [facility] for tasks assessed/performed           Past Medical History:  Diagnosis Date  . Diabetes (HCC)   . Diabetes mellitus without complication (HCC)   . Hyperlipemia   . Hyperlipemia     History reviewed. No pertinent surgical history.  There were no vitals filed for this visit.   Subjective Assessment - 08/23/19 1705    Subjective Wet weather recently made the shoulder ache, "I feel like it is moving better"    Currently in Pain? Yes    Pain Score 1     Pain Location Shoulder    Pain Orientation Right    Pain Descriptors / Indicators Sharp    Aggravating Factors  any movements              [facility] PT Assessment - 08/23/19 0001      AROM   Right Shoulder Flexion 80 Degrees    Right Shoulder ABduction 35 Degrees    Right Shoulder Internal Rotation 25 Degrees    Right Shoulder External Rotation 8 Degrees      PROM   Right Shoulder Flexion 105 Degrees    Right Shoulder ABduction 60 Degrees    Right Shoulder Internal Rotation 30 Degrees    Right Shoulder External Rotation 15 Degrees                         [facility] Adult PT Treatment/Exercise - 08/23/19 0001      Modalities   Modalities Moist Heat;Electrical Stimulation      Moist Heat Therapy   Number Minutes Moist Heat 10 Minutes    Moist Heat Location Shoulder      Electrical Stimulation   Electrical  Stimulation Location right posterior shoulder teres area    Electrical Stimulation Action IFC    Electrical Stimulation Parameters supine    Electrical Stimulation Goals Pain      Manual Therapy   Manual Therapy Soft tissue mobilization;Joint mobilization;Passive ROM    Joint Mobilization grade 1-2 for pain    Soft tissue mobilization to biceps, triceps, UT    Passive ROM all shld directions; very tight in IR/ER; painful in all directions, worked a lot on contract relax to gain ROM and get him to relax                       PT Long Term Goals - 08/23/19 1757      PT LONG TERM GOAL #1   Title "Pt will be independent with advanced HEP.     Status On-going      PT LONG TERM GOAL #2   Title "Pain will decrease to 3/10or less with all functional activities    Status On-going      PT LONG TERM  GOAL #3   Title increase AROM of the right shoulder to 120 degrees flexion    Status On-going      PT LONG TERM GOAL #4   Title increase right shoulder AROM to 60 degrees ER      PT LONG TERM GOAL #5   Title increase right shoulder IR to 55 degrees    Status On-going                 Plan - 08/23/19 1756    Clinical Impression Statement Patient demonstrated some increase in AROM and PROM from evaluation, he does report the exercises he is doing at home and was able to demo, I tried some contract relax today to gain ROM and tried the estim to see if we could help pain and relax the mms that are gaurding the motions    PT Next Visit Plan see if the changes I made today helped    Consulted and Agree with Plan of Care Patient           Patient will benefit from skilled therapeutic intervention in order to improve the following deficits and impairments:  Decreased range of motion, Increased muscle spasms, Pain, Improper body mechanics, Impaired flexibility, Impaired UE functional use, Decreased strength, Postural dysfunction  Visit Diagnosis: Acute pain of right  shoulder  Stiffness of right shoulder, not elsewhere classified     Problem List Patient Active Problem List   Diagnosis Date Noted  . Hyperlipidemia associated with type 2 diabetes mellitus (HCC) 11/17/2018  . Chronic bilateral low back pain without sciatica 01/20/2018  . Chronic left shoulder pain 01/20/2018  . Fatigue 01/20/2018  . Neck pain 01/20/2018  . Uncontrolled type 2 diabetes mellitus with diabetic neuropathy, with long-term current use of insulin (HCC) 01/20/2018  . Uncontrolled type 2 diabetes mellitus with hyperglycemia, with long-term current use of insulin (HCC) 10/05/2017  . Neuropathy 06/09/2016  . Localization-related idiopathic epilepsy and epileptic syndromes with seizures of localized onset, not intractable, without status epilepticus (HCC) 02/14/2015    Jearld Lesch., PT 08/23/2019, 5:58 PM  [facility] [place] [address] [place], [place], [address] Phone: [phone]   Fax:  [phone]  Name: Donald Simmons MRN: [id number] Date of Birth: 01-10-1976

## 2019-08-30 ENCOUNTER — Other Ambulatory Visit: Payer: Self-pay

## 2019-08-30 ENCOUNTER — Encounter: Payer: Self-pay | Admitting: Physical Therapy

## 2019-08-30 ENCOUNTER — Ambulatory Visit: Payer: Medicaid Other | Admitting: Physical Therapy

## 2019-08-30 DIAGNOSIS — M25511 Pain in right shoulder: Secondary | ICD-10-CM

## 2019-08-30 DIAGNOSIS — M25611 Stiffness of right shoulder, not elsewhere classified: Secondary | ICD-10-CM

## 2019-08-30 NOTE — Therapy (Signed)
[place], [place], [address] Phone: [phone]   Fax:  [phone]  Physical Therapy Treatment  Patient Details  Name: Donald Simmons MRN: [id number] Date of Birth: 11-30-1975 Referring Provider (PT): Brumfield   Encounter Date: 08/30/2019   PT End of Session - 08/30/19 1735    Visit Number 5    Authorization Type Medicaid    PT Start Time 1700    PT Stop Time 1750    PT Time Calculation (min) 50 min    Activity Tolerance Patient limited by pain    Behavior During Therapy [facility] for tasks assessed/performed           Past Medical History:  Diagnosis Date  . Diabetes ([place])   . Diabetes mellitus without complication ([place])   . Hyperlipemia   . Hyperlipemia     History reviewed. No pertinent surgical history.  There were no vitals filed for this visit.   Subjective Assessment - 08/30/19 1710    Subjective I think I felt a little better    Currently in Pain? No/denies    Pain Score 0-No pain    Aggravating Factors  movements increase the pain    Pain Relieving Factors not moving I cna have no pain                             [facility] Adult PT Treatment/Exercise - 08/30/19 0001      Shoulder Exercises: Standing   Internal Rotation AROM;15 reps    Internal Rotation Limitations with wand and PT overpressure    Extension AAROM;15 reps    Extension Limitations with wand and PT overpressure to gain ROM      Shoulder Exercises: ROM/Strengthening   Nustep level 3 x 6 minutes    Wall Wash AAROM with pillow case flexion, small circles    Other ROM/Strengthening Exercises ball vs wall chest high x 10      Modalities   Modalities Moist Heat;Electrical Stimulation      Moist Heat Therapy   Number Minutes Moist Heat 10 Minutes    Moist Heat Location Shoulder      Electrical Stimulation   Electrical Stimulation Location right posterior shoulder teres area     Electrical Stimulation Action IFC    Electrical Stimulation Parameters supine    Electrical Stimulation Goals Pain      Manual Therapy   Manual Therapy Soft tissue mobilization;Joint mobilization;Passive ROM    Soft tissue mobilization to biceps, triceps, UT    Passive ROM all shld directions; very tight in IR/ER; painful in all directions, worked a lot on contract relax to gain ROM and get him to relax                       PT Long Term Goals - 08/30/19 1739      PT LONG TERM GOAL #1   Title "Pt will be independent with advanced HEP.     Status Partially Met      PT LONG TERM GOAL #2   Title "Pain will decrease to 3/10or less with all functional activities    Status On-going      PT LONG TERM GOAL #3   Title increase AROM of the right shoulder to 120 degrees flexion    Status Partially Met      PT LONG TERM GOAL #4   Title increase  right shoulder AROM to 60 degrees ER    Status On-going                 Plan - 08/30/19 1736    Clinical Impression Statement Patient seemed a little more able to move on his own with a little less, pain, he is still very tight and guarded and still having pain.  He felt like the estim helped with soreness last time.  I tried to get him being in charge of the arm then provide overpressure with the movement to get better ROM    PT Next Visit Plan will see if we can continue to push some ROM, measure    Consulted and Agree with Plan of Care Patient           Patient will benefit from skilled therapeutic intervention in order to improve the following deficits and impairments:  Decreased range of motion, Increased muscle spasms, Pain, Improper body mechanics, Impaired flexibility, Impaired UE functional use, Decreased strength, Postural dysfunction  Visit Diagnosis: Acute pain of right shoulder  Stiffness of right shoulder, not elsewhere classified     Problem List Patient Active Problem List   Diagnosis Date Noted  .  Hyperlipidemia associated with type 2 diabetes mellitus ([place]) 11/17/2018  . Chronic bilateral low back pain without sciatica 01/20/2018  . Chronic left shoulder pain 01/20/2018  . Fatigue 01/20/2018  . Neck pain 01/20/2018  . Uncontrolled type 2 diabetes mellitus with diabetic neuropathy, with long-term current use of insulin ([place]) 01/20/2018  . Uncontrolled type 2 diabetes mellitus with hyperglycemia, with long-term current use of insulin ([place]) 10/05/2017  . Neuropathy 06/09/2016  . Localization-related idiopathic epilepsy and epileptic syndromes with seizures of localized onset, not intractable, without status epilepticus ([place]) 02/14/2015    Sumner Boast., PT 08/30/2019, 5:40 PM  [place], [place], [address] Phone: [phone]   Fax:  [phone]  Name: Donald Simmons MRN: [id number] Date of Birth: 12-16-1975

## 2019-09-06 ENCOUNTER — Other Ambulatory Visit: Payer: Self-pay

## 2019-09-06 ENCOUNTER — Encounter: Payer: Self-pay | Admitting: Physical Therapy

## 2019-09-06 ENCOUNTER — Ambulatory Visit: Payer: Medicaid Other | Attending: Sports Medicine | Admitting: Physical Therapy

## 2019-09-06 DIAGNOSIS — M25511 Pain in right shoulder: Secondary | ICD-10-CM | POA: Insufficient documentation

## 2019-09-06 DIAGNOSIS — M25611 Stiffness of right shoulder, not elsewhere classified: Secondary | ICD-10-CM | POA: Diagnosis present

## 2019-09-06 NOTE — Therapy (Signed)
[place], [place], [address] Phone: [phone]   Fax:  [phone]  Physical Therapy Treatment  Patient Details  Name: Donald Simmons MRN: [id number] Date of Birth: 11-Mar-1975 Referring Provider (PT): Brumfield   Encounter Date: 09/06/2019   PT End of Session - 09/06/19 1742    Visit Number 6    Number of Visits 27    Date for PT Re-Evaluation 10/31/19    Authorization Type Medicaid    PT Start Time 1700    PT Stop Time 7858    PT Time Calculation (min) 52 min    Activity Tolerance Patient limited by pain    Behavior During Therapy [facility] for tasks assessed/performed           Past Medical History:  Diagnosis Date  . Diabetes ([place])   . Diabetes mellitus without complication ([place])   . Hyperlipemia   . Hyperlipemia     History reviewed. No pertinent surgical history.  There were no vitals filed for this visit.   Subjective Assessment - 09/06/19 1704    Subjective Patient reports that the right shoulder is hurting worse today, "just one of those days" reports weather bothers it    Currently in Pain? Yes    Pain Score 8     Pain Location Shoulder    Pain Orientation Right;Anterior    Aggravating Factors  weather              [facility] PT Assessment - 09/06/19 0001      PROM   Right Shoulder Flexion 112 Degrees    Right Shoulder External Rotation 25 Degrees                         [facility] Adult PT Treatment/Exercise - 09/06/19 0001      Shoulder Exercises: Supine   Flexion Limitations supine wand chest press and then flexion x 10 each      Shoulder Exercises: Standing   Extension Right;10 reps    Extension Limitations using pulley system 5#    Row Right;10 reps    Row Limitations on pulleys with 5#    Other Standing Exercises wand ER, IR, extensionx 20 reps      Shoulder Exercises: ROM/Strengthening   Nustep level 3 x 6 minutes    Lat Pull 1.5 plate;10  reps    Lat Pull Limitations very limited motions, required assist    Wall Wash AAROM with pillow case flexion, small circles      Moist Heat Therapy   Number Minutes Moist Heat 10 Minutes    Moist Heat Location Shoulder      Electrical Stimulation   Electrical Stimulation Location right posterior shoulder teres area    Electrical Stimulation Action IFC    Electrical Stimulation Parameters supine    Electrical Stimulation Goals Pain      Manual Therapy   Manual Therapy Soft tissue mobilization;Joint mobilization;Passive ROM    Joint Mobilization grade 1-2 for pain    Soft tissue mobilization to biceps, triceps, UT    Passive ROM all shld directions; very tight in IR/ER; painful in all directions, worked a lot on contract relax to gain ROM and get him to relax                       PT Long Term Goals - 09/06/19 1754      PT LONG TERM  GOAL #1   Title "Pt will be independent with advanced HEP.     Status Partially Met      PT LONG TERM GOAL #2   Title "Pain will decrease to 3/10or less with all functional activities    Status On-going      PT LONG TERM GOAL #3   Title increase AROM of the right shoulder to 120 degrees flexion    Status Partially Met      PT LONG TERM GOAL #4   Title increase right shoulder AROM to 60 degrees ER    Status On-going                 Plan - 09/06/19 1743    Clinical Impression Statement Patient was able to tolerate a little more contract relax to gain ROM, he is still very guarded and tight, he is gaining ROM, he is very slow and tends to protect the right arm and hold against his side. The contract relax seems to help some but he cannot contract  much against me, the scapular area is very tight    PT Next Visit Plan continue to try to gain the ROM for increase function    Consulted and Agree with Plan of Care Patient           Patient will benefit from skilled therapeutic intervention in order to improve the following  deficits and impairments:  Decreased range of motion, Increased muscle spasms, Pain, Improper body mechanics, Impaired flexibility, Impaired UE functional use, Decreased strength, Postural dysfunction  Visit Diagnosis: Acute pain of right shoulder  Stiffness of right shoulder, not elsewhere classified     Problem List Patient Active Problem List   Diagnosis Date Noted  . Hyperlipidemia associated with type 2 diabetes mellitus ([place]) 11/17/2018  . Chronic bilateral low back pain without sciatica 01/20/2018  . Chronic left shoulder pain 01/20/2018  . Fatigue 01/20/2018  . Neck pain 01/20/2018  . Uncontrolled type 2 diabetes mellitus with diabetic neuropathy, with long-term current use of insulin ([place]) 01/20/2018  . Uncontrolled type 2 diabetes mellitus with hyperglycemia, with long-term current use of insulin ([place]) 10/05/2017  . Neuropathy 06/09/2016  . Localization-related idiopathic epilepsy and epileptic syndromes with seizures of localized onset, not intractable, without status epilepticus ([place]) 02/14/2015    Sumner Boast., PT 09/06/2019, 5:55 PM  [place], [place], [address] Phone: [phone]   Fax:  [phone]  Name: Delontae Lamm MRN: [id number] Date of Birth: March 22, 1975

## 2019-10-05 ENCOUNTER — Other Ambulatory Visit: Payer: Self-pay

## 2019-10-05 ENCOUNTER — Ambulatory Visit: Payer: Medicaid Other | Attending: Sports Medicine | Admitting: Physical Therapy

## 2019-10-05 ENCOUNTER — Encounter: Payer: Self-pay | Admitting: Physical Therapy

## 2019-10-05 DIAGNOSIS — M25611 Stiffness of right shoulder, not elsewhere classified: Secondary | ICD-10-CM | POA: Diagnosis present

## 2019-10-05 DIAGNOSIS — M25511 Pain in right shoulder: Secondary | ICD-10-CM | POA: Insufficient documentation

## 2019-10-05 NOTE — Therapy (Signed)
[place], [place], [address] Phone: [phone]   Fax:  [phone]  Physical Therapy Treatment  Patient Details  Name: Donald Simmons MRN: [id number] Date of Birth: 1976/01/04 Referring Provider (PT): Brumfield   Encounter Date: 10/05/2019   PT End of Session - 10/05/19 1757    Visit Number 7    Number of Visits 27    Date for PT Re-Evaluation 10/31/19    Authorization Type Medicaid    PT Start Time 1657    PT Stop Time 1738    PT Time Calculation (min) 41 min    Activity Tolerance Patient limited by pain    Behavior During Therapy [facility] for tasks assessed/performed           Past Medical History:  Diagnosis Date  . Diabetes ([place])   . Diabetes mellitus without complication ([place])   . Hyperlipemia   . Hyperlipemia     History reviewed. No pertinent surgical history.  There were no vitals filed for this visit.   Subjective Assessment - 10/05/19 1706    Subjective Patient reports that he was feeling okay until the rain came, then really started to hurt.  He reports that he tried to go to an orientation for a job and reports that he could not do it due to the lifting    Currently in Pain? Yes    Pain Score 7     Pain Location Shoulder    Pain Orientation Right    Aggravating Factors  weather, lifting, reaching              [facility] PT Assessment - 10/05/19 0001      AROM   Right Shoulder Flexion 90 Degrees    Right Shoulder ABduction 70 Degrees    Right Shoulder Internal Rotation 38 Degrees    Right Shoulder External Rotation 12 Degrees                         [facility] Adult PT Treatment/Exercise - 10/05/19 0001      Shoulder Exercises: Prone   Other Prone Exercises 2# chest press, 2# isometric circles      Shoulder Exercises: Standing   Extension Both;10 reps    Extension Limitations using pulley system 5#    Row Both;20 reps    Row Limitations on pulleys  with 5#    Other Standing Exercises under pull up bar having him reach up and hold for 15 seconds    Other Standing Exercises wand ER, IR, extensionx 20 reps      Shoulder Exercises: ROM/Strengthening   Nustep level 4 x 6 minutes      Manual Therapy   Manual Therapy Soft tissue mobilization;Joint mobilization;Passive ROM    Joint Mobilization grade 1-2 for pain    Soft tissue mobilization to biceps, triceps, UT    Passive ROM all shld directions; very tight in IR/ER; painful in all directions, worked a lot on contract relax to gain ROM and get him to relax                       PT Long Term Goals - 10/05/19 1803      PT LONG TERM GOAL #1   Title "Pt will be independent with advanced HEP.       PT LONG TERM GOAL #2   Title "Pain will decrease to 3/10or less with all  functional activities    Status On-going      PT LONG TERM GOAL #3   Title increase AROM of the right shoulder to 120 degrees flexion    Status Partially Met                 Plan - 10/05/19 1757    Clinical Impression Statement [place] with some increases in AROM, he is still very tight and painful.  Once his pain starts.  He really has diffiuclty with ROM and activity due to the pain levels.  He seemed to tolerate some increased weights and activity but after some stretching he was not able to do much    PT Next Visit Plan continue to try to gain the ROM for increase function    Consulted and Agree with Plan of Care Patient           Patient will benefit from skilled therapeutic intervention in order to improve the following deficits and impairments:  Decreased range of motion, Increased muscle spasms, Pain, Improper body mechanics, Impaired flexibility, Impaired UE functional use, Decreased strength, Postural dysfunction  Visit Diagnosis: Acute pain of right shoulder  Stiffness of right shoulder, not elsewhere classified     Problem List Patient Active Problem List   Diagnosis Date  Noted  . Hyperlipidemia associated with type 2 diabetes mellitus ([place]) 11/17/2018  . Chronic bilateral low back pain without sciatica 01/20/2018  . Chronic left shoulder pain 01/20/2018  . Fatigue 01/20/2018  . Neck pain 01/20/2018  . Uncontrolled type 2 diabetes mellitus with diabetic neuropathy, with long-term current use of insulin ([place]) 01/20/2018  . Uncontrolled type 2 diabetes mellitus with hyperglycemia, with long-term current use of insulin ([place]) 10/05/2017  . Neuropathy 06/09/2016  . Localization-related idiopathic epilepsy and epileptic syndromes with seizures of localized onset, not intractable, without status epilepticus ([place]) 02/14/2015    Sumner Boast., PT 10/05/2019, 6:04 PM  [place], [place], [address] Phone: [phone]   Fax:  [phone]  Name: Gor Vestal MRN: [id number] Date of Birth: [birth year]

## 2019-10-11 ENCOUNTER — Telehealth (INDEPENDENT_AMBULATORY_CARE_PROVIDER_SITE_OTHER): Payer: Medicaid Other | Admitting: Neurology

## 2019-10-11 ENCOUNTER — Encounter: Payer: Self-pay | Admitting: Neurology

## 2019-10-11 ENCOUNTER — Other Ambulatory Visit: Payer: Self-pay

## 2019-10-11 VITALS — Ht 73.0 in | Wt 175.0 lb

## 2019-10-11 DIAGNOSIS — G40019 Localization-related (focal) (partial) idiopathic epilepsy and epileptic syndromes with seizures of localized onset, intractable, without status epilepticus: Secondary | ICD-10-CM | POA: Diagnosis not present

## 2019-10-11 MED ORDER — APTIOM 400 MG PO TABS: ORAL_TABLET | ORAL | 11 refills | Status: DC

## 2019-10-11 MED ORDER — LAMOTRIGINE 100 MG PO TABS: ORAL_TABLET | ORAL | 11 refills | Status: DC
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
## 2019-10-12 ENCOUNTER — Encounter: Payer: Medicaid Other | Admitting: Physical Therapy

## 2019-10-19 ENCOUNTER — Ambulatory Visit: Payer: Medicaid Other | Admitting: Physical Therapy

## 2019-11-01 ENCOUNTER — Ambulatory Visit: Payer: Medicaid Other | Admitting: Physical Therapy

## 2019-11-01 ENCOUNTER — Other Ambulatory Visit: Payer: Self-pay

## 2019-11-01 DIAGNOSIS — M25511 Pain in right shoulder: Secondary | ICD-10-CM

## 2019-11-01 DIAGNOSIS — M25611 Stiffness of right shoulder, not elsewhere classified: Secondary | ICD-10-CM

## 2019-11-01 NOTE — Therapy (Signed)
[place] [place], [place], [address] Phone: [phone]   Fax:  [phone]  Physical Therapy Treatment  Patient Details  Name: Donald Simmons MRN: [id number] Date of Birth: 09/16/1975 Referring Provider (PT): Brumfield   Encounter Date: 11/01/2019   PT End of Session - 11/01/19 6962    Visit Number 8    Number of Visits 27    Date for PT Re-Evaluation 10/31/19    PT Start Time 9528    PT Stop Time 4132    PT Time Calculation (min) 51 min           Past Medical History:  Diagnosis Date  . Diabetes ([place])   . Diabetes mellitus without complication ([place])   . Frozen shoulder   . Hyperlipemia   . Hyperlipemia     No past surgical history on file.  There were no vitals filed for this visit.   Subjective Assessment - 11/01/19 1700    Subjective therapy is helping. "cant believe its been a month since I have been here". thsi cold weather is hurting me.    Currently in Pain? Yes    Pain Score 6     Pain Location Shoulder    Pain Orientation Right              [facility] PT Assessment - 11/01/19 0001      AROM   Right/Left Shoulder Right   standing   Right Shoulder Flexion 102 Degrees    Right Shoulder ABduction 75 Degrees    Right Shoulder Internal Rotation 40 Degrees    Right Shoulder External Rotation 35 Degrees                         [facility] Adult PT Treatment/Exercise - 11/01/19 0001      Shoulder Exercises: Supine   External Rotation Strengthening;Right;10 reps;Weights    External Rotation Weight (lbs) 2    Internal Rotation Strengthening;Right;10 reps;Weights    Internal Rotation Weight (lbs) 2    Flexion Strengthening;Right;10 reps;Weights    Shoulder Flexion Weight (lbs) 2      Shoulder Exercises: Sidelying   External Rotation Strengthening;Right;10 reps;Weights    External Rotation Weight (lbs) 2    ABduction Strengthening;Right;10 reps;Weights    ABduction Weight  (lbs) 2      Shoulder Exercises: Standing   Extension Strengthening;Both;10 reps   5# cable pulleys   Row Both;10 reps   5# cable pulleys   Other Standing Exercises 3# BIL shld ext and row 10 each- cued to engage and squeeze scapulal      Shoulder Exercises: ROM/Strengthening   UBE (Upper Arm Bike) L 3 3 min fwd/3 min backward      Moist Heat Therapy   Number Minutes Moist Heat 10 Minutes    Moist Heat Location Shoulder      Electrical Stimulation   Electrical Stimulation Location right shld    Electrical Stimulation Action IFC    Electrical Stimulation Parameters supine    Electrical Stimulation Goals Pain      Manual Therapy   Manual Therapy Soft tissue mobilization;Joint mobilization;Passive ROM    Joint Mobilization grade 1-2 for pain    Soft tissue mobilization shld to relax to increase ROM    Passive ROM all shld directions; very tight in IR/ER; painful in all directions, worked a lot on contract relax to gain ROM and get him to relax   with and w/o  CR                   PT Short Term Goals - 11/01/19 1704      PT SHORT TERM GOAL #1   Title "Independent with initial HEP    Status Achieved             PT Long Term Goals - 11/01/19 1704      PT LONG TERM GOAL #1   Title "Pt will be independent with advanced HEP.     Status Partially Met      PT LONG TERM GOAL #2   Title "Pain will decrease to 3/10or less with all functional activities    Status On-going      PT LONG TERM GOAL #3   Title increase AROM of the right shoulder to 120 degrees flexion    Status Partially Met      PT LONG TERM GOAL #4   Title increase right shoulder AROM to 60 degrees ER    Status Partially Met      PT LONG TERM GOAL #5   Title increase right shoulder IR to 55 degrees    Status Partially Met                 Plan - 11/01/19 1735    Clinical Impression Statement pt has not been to PT in almost 1 month- verb " doesn't seem that long" no other explanation given.  Pt with small increases in AROm which where taken at start of session bit still very limited. pt slow and pain limited with ex- PTA to assist with decreasing compensations and increase AROM. pt with scapular winging with ex- cued to squeeze scap with ex. pt slow with goal progress.    PT Treatment/Interventions ADLs/Self Care Home Management;Electrical Stimulation;Cryotherapy;Iontophoresis 43m/ml Dexamethasone;Moist Heat;Traction;Ultrasound;Therapeutic exercise;Therapeutic activities;Patient/family education;Manual techniques;Passive range of motion    PT Next Visit Plan continue to try to gain the ROM for increase function           Patient will benefit from skilled therapeutic intervention in order to improve the following deficits and impairments:  Decreased range of motion, Increased muscle spasms, Pain, Improper body mechanics, Impaired flexibility, Impaired UE functional use, Decreased strength, Postural dysfunction  Visit Diagnosis: Acute pain of right shoulder  Stiffness of right shoulder, not elsewhere classified     Problem List Patient Active Problem List   Diagnosis Date Noted  . Hyperlipidemia associated with type 2 diabetes mellitus ([place] 11/17/2018  . Chronic bilateral low back pain without sciatica 01/20/2018  . Chronic left shoulder pain 01/20/2018  . Fatigue 01/20/2018  . Neck pain 01/20/2018  . Uncontrolled type 2 diabetes mellitus with diabetic neuropathy, with long-term current use of insulin ([place] 01/20/2018  . Uncontrolled type 2 diabetes mellitus with hyperglycemia, with long-term current use of insulin ([place] 10/05/2017  . Neuropathy 06/09/2016  . Localization-related idiopathic epilepsy and epileptic syndromes with seizures of localized onset, not intractable, without status epilepticus ([place] 02/14/2015    Aulton Routt,ANGIE PTA 11/01/2019, 5:38 PM  [place] [address]Phone:  [phone]  Fax:  [phone] Name: CZavior ThomasonMRN: [id number] of Birth: [birth year]

## 2019-11-08 ENCOUNTER — Encounter: Payer: Self-pay | Admitting: Physical Therapy

## 2019-11-08 ENCOUNTER — Ambulatory Visit: Payer: Medicaid Other | Attending: Sports Medicine | Admitting: Physical Therapy

## 2019-11-08 ENCOUNTER — Other Ambulatory Visit: Payer: Self-pay

## 2019-11-08 DIAGNOSIS — M25611 Stiffness of right shoulder, not elsewhere classified: Secondary | ICD-10-CM | POA: Insufficient documentation

## 2019-11-08 DIAGNOSIS — M25511 Pain in right shoulder: Secondary | ICD-10-CM | POA: Insufficient documentation

## 2019-11-08 NOTE — Therapy (Signed)
[place]. [place], [place], [address] Phone: [phone]   Fax:  [phone]  Physical Therapy Treatment  Patient Details  Name: Donald Simmons MRN: [id number] Date of Birth: 1976/01/16 Referring Provider (PT): Brumfield   Encounter Date: 11/08/2019   PT End of Session - 11/08/19 1523    Visit Number 9    Number of Visits 27    Date for PT Re-Evaluation 10/31/19    Authorization Type Medicaid    PT Start Time 1430    PT Stop Time 1524    PT Time Calculation (min) 54 min    Activity Tolerance Patient limited by pain    Behavior During Therapy [facility] for tasks assessed/performed           Past Medical History:  Diagnosis Date  . Diabetes ([place])   . Diabetes mellitus without complication ([place])   . Frozen shoulder   . Hyperlipemia   . Hyperlipemia     History reviewed. No pertinent surgical history.  There were no vitals filed for this visit.   Subjective Assessment - 11/08/19 1435    Subjective Pt reports that his shoulder is hurting today because it is cold outside in the mornings    Currently in Pain? Yes    Pain Score 8     Pain Location Shoulder    Pain Orientation Right                             [facility] Adult PT Treatment/Exercise - 11/08/19 0001      Shoulder Exercises: Standing   External Rotation Strengthening;Theraband;20 reps    Theraband Level (Shoulder External Rotation) Level 1 (Yellow)    Internal Rotation Theraband;20 reps;Right    Theraband Level (Shoulder Internal Rotation) Level 1 (Yellow)    Extension Strengthening;Theraband;20 reps;Both    Theraband Level (Shoulder Extension) Level 1 (Yellow)    Row 5 reps;20 reps;Theraband;Strengthening;Both    Theraband Level (Shoulder Row) Level 1 (Yellow)    Other Standing Exercises Standing shoulder flex and and x5     Other Standing Exercises Wand AAROM flex, ext, IR up back       Shoulder Exercises:  ROM/Strengthening   UBE (Upper Arm Bike) L 3 3 min fwd/3 min backward      Moist Heat Therapy   Number Minutes Moist Heat 12 Minutes    Moist Heat Location Shoulder      Electrical Stimulation   Electrical Stimulation Location right shld    Electrical Stimulation Action IFC    Electrical Stimulation Parameters supine    Electrical Stimulation Goals Pain      Manual Therapy   Manual Therapy Soft tissue mobilization;Joint mobilization;Passive ROM    Manual therapy comments RUE is very tight pt had difficult time relaxing    Joint Mobilization grade 1-2 for pain    Passive ROM all shld directions; very tight in IR/ER; painful in all directions,                    PT Short Term Goals - 11/01/19 1704      PT SHORT TERM GOAL #1   Title "Independent with initial HEP    Status Achieved             PT Long Term Goals - 11/01/19 1704      PT LONG TERM GOAL #1   Title "Pt will be independent with advanced HEP.  Status Partially Met      PT LONG TERM GOAL #2   Title "Pain will decrease to 3/10or less with all functional activities    Status On-going      PT LONG TERM GOAL #3   Title increase AROM of the right shoulder to 120 degrees flexion    Status Partially Met      PT LONG TERM GOAL #4   Title increase right shoulder AROM to 60 degrees ER    Status Partially Met      PT LONG TERM GOAL #5   Title increase right shoulder IR to 55 degrees    Status Partially Met                 Plan - 11/08/19 1525    Clinical Impression Statement Pt hypo verbal during treatment session often rubbing his anterior R shoulder throughout session. Pt cued to squeeze scapulas together with exercises. Postural cues to prevent forward trunk lean with extensions. Compensation noted with MT pt often times arching his back with passive flexion, scaption, and abduction despite cues to relax. Some scapular anchoring with passive R should flex helped gain AROM.     Stability/Clinical Decision Making Stable/Uncomplicated    Rehab Potential Fair    PT Frequency 1x / week    PT Duration 12 weeks    PT Treatment/Interventions ADLs/Self Care Home Management;Electrical Stimulation;Cryotherapy;Iontophoresis 4mg /ml Dexamethasone;Moist Heat;Traction;Ultrasound;Therapeutic exercise;Therapeutic activities;Patient/family education;Manual techniques;Passive range of motion    PT Next Visit Plan continue to try to gain the ROM for increase function           Patient will benefit from skilled therapeutic intervention in order to improve the following deficits and impairments:  Decreased range of motion, Increased muscle spasms, Pain, Improper body mechanics, Impaired flexibility, Impaired UE functional use, Decreased strength, Postural dysfunction  Visit Diagnosis: Stiffness of right shoulder, not elsewhere classified  Acute pain of right shoulder     Problem List Patient Active Problem List   Diagnosis Date Noted  . Hyperlipidemia associated with type 2 diabetes mellitus ([place]) 11/17/2018  . Chronic bilateral low back pain without sciatica 01/20/2018  . Chronic left shoulder pain 01/20/2018  . Fatigue 01/20/2018  . Neck pain 01/20/2018  . Uncontrolled type 2 diabetes mellitus with diabetic neuropathy, with long-term current use of insulin ([place]) 01/20/2018  . Uncontrolled type 2 diabetes mellitus with hyperglycemia, with long-term current use of insulin ([place]) 10/05/2017  . Neuropathy 06/09/2016  . Localization-related idiopathic epilepsy and epileptic syndromes with seizures of localized onset, not intractable, without status epilepticus ([place]) 02/14/2015    Scot Jun, PTA 11/08/2019, 3:33 PM  [place] [place]. [place], [place], [address] Phone: [phone]   Fax:  [phone]  Name: Kenneith Stief MRN: [id number] Date of Birth: 1976-01-08

## 2019-11-16 ENCOUNTER — Ambulatory Visit: Payer: Medicaid Other | Admitting: Physical Therapy

## 2019-11-16 ENCOUNTER — Encounter: Payer: Self-pay | Admitting: Physical Therapy

## 2019-11-16 ENCOUNTER — Other Ambulatory Visit: Payer: Self-pay

## 2019-11-16 DIAGNOSIS — M25611 Stiffness of right shoulder, not elsewhere classified: Secondary | ICD-10-CM | POA: Diagnosis not present

## 2019-11-16 DIAGNOSIS — M25511 Pain in right shoulder: Secondary | ICD-10-CM

## 2019-11-16 NOTE — Therapy (Signed)
[place]. [place], [place], [address] Phone: [phone]   Fax:  [phone]  Physical Therapy Treatment  Patient Details  Name: Donald Simmons MRN: [id number] Date of Birth: Jul 01, 1975 Referring Provider (PT): Brumfield   Encounter Date: 11/16/2019   PT End of Session - 11/16/19 1735    Visit Number 10    Number of Visits 27    Date for PT Re-Evaluation 01/03/20    Authorization Type Medicaid    PT Start Time 1700    PT Stop Time 9201    PT Time Calculation (min) 52 min    Activity Tolerance Patient limited by pain    Behavior During Therapy [facility] for tasks assessed/performed           Past Medical History:  Diagnosis Date  . Diabetes ([place])   . Diabetes mellitus without complication ([place])   . Frozen shoulder   . Hyperlipemia   . Hyperlipemia     History reviewed. No pertinent surgical history.  There were no vitals filed for this visit.   Subjective Assessment - 11/16/19 1701    Subjective I think that it is getting a little better.,  Reports that he saw the MD last week and he is to continue PT    Currently in Pain? Yes    Pain Score 7     Pain Location Shoulder    Pain Orientation Right    Aggravating Factors  activity movements              [facility] PT Assessment - 11/16/19 0001      AROM   Right Shoulder Flexion 108 Degrees      PROM   Right Shoulder Flexion 117 Degrees                         [facility] Adult PT Treatment/Exercise - 11/16/19 0001      Shoulder Exercises: Seated   Other Seated Exercises bent over row 2#, extensio, reverse flies no weightn no weight      Shoulder Exercises: Standing   External Rotation Strengthening;Theraband;20 reps    Theraband Level (Shoulder External Rotation) Level 2 (Red)    Other Standing Exercises ball toss, underhand and regular    Other Standing Exercises Wand AAROM flex, ext, IR up back       Shoulder Exercises:  ROM/Strengthening   UBE (Upper Arm Bike) L 3 3 min fwd/3 min backward    Lat Pull 1.5 plate;20 reps    Cybex Press 1 plate;20 reps    Cybex Row 1 plate;20 reps    Wall Wash AAROM with pillow case flexion, small circles    Other ROM/Strengthening Exercises 5# straight arm pulls      Moist Heat Therapy   Number Minutes Moist Heat 12 Minutes    Moist Heat Location Shoulder      Electrical Stimulation   Electrical Stimulation Location right shld    Electrical Stimulation Action IFC    Electrical Stimulation Parameters supine    Electrical Stimulation Goals Pain                    PT Short Term Goals - 11/01/19 1704      PT SHORT TERM GOAL #1   Title "Independent with initial HEP    Status Achieved             PT Long Term Goals - 11/16/19 1739  PT LONG TERM GOAL #1   Title "Pt will be independent with advanced HEP.     Status Partially Met      PT LONG TERM GOAL #2   Title "Pain will decrease to 3/10or less with all functional activities    Status On-going      PT LONG TERM GOAL #3   Title increase AROM of the right shoulder to 120 degrees flexion    Status Partially Met                 Plan - 11/16/19 1738    Clinical Impression Statement I have not seen this patient in a few weeks, he really seems to be moving better and with less difficulty and pain, he has some increase in both PROM and AROM.  He is very stiff and gaurded with the first few reps but then with encouragement and some passive help the motions will get much better and close to normal for the lats, the rows and the chest press    PT Next Visit Plan continue to try to gain the ROM for increase function    Consulted and Agree with Plan of Care Patient           Patient will benefit from skilled therapeutic intervention in order to improve the following deficits and impairments:  Decreased range of motion, Increased muscle spasms, Pain, Improper body mechanics, Impaired  flexibility, Impaired UE functional use, Decreased strength, Postural dysfunction  Visit Diagnosis: Stiffness of right shoulder, not elsewhere classified  Acute pain of right shoulder     Problem List Patient Active Problem List   Diagnosis Date Noted  . Hyperlipidemia associated with type 2 diabetes mellitus ([place]) 11/17/2018  . Chronic bilateral low back pain without sciatica 01/20/2018  . Chronic left shoulder pain 01/20/2018  . Fatigue 01/20/2018  . Neck pain 01/20/2018  . Uncontrolled type 2 diabetes mellitus with diabetic neuropathy, with long-term current use of insulin ([place]) 01/20/2018  . Uncontrolled type 2 diabetes mellitus with hyperglycemia, with long-term current use of insulin ([place]) 10/05/2017  . Neuropathy 06/09/2016  . Localization-related idiopathic epilepsy and epileptic syndromes with seizures of localized onset, not intractable, without status epilepticus ([place]) 02/14/2015    Sumner Boast., PT 11/16/2019, 5:40 PM  [place] [address]. [place], [place], [address] Phone: [phone]   Fax:  [phone]  Name: Donald Simmons MRN: [id number] Date of Birth: [birth year]

## 2019-11-23 ENCOUNTER — Encounter: Payer: Medicaid Other | Admitting: Physical Therapy

## 2019-11-30 ENCOUNTER — Ambulatory Visit: Payer: Medicaid Other | Admitting: Physical Therapy

## 2019-11-30 ENCOUNTER — Other Ambulatory Visit: Payer: Self-pay

## 2019-11-30 ENCOUNTER — Encounter: Payer: Self-pay | Admitting: Physical Therapy

## 2019-11-30 DIAGNOSIS — M25511 Pain in right shoulder: Secondary | ICD-10-CM

## 2019-11-30 DIAGNOSIS — M25611 Stiffness of right shoulder, not elsewhere classified: Secondary | ICD-10-CM | POA: Diagnosis not present

## 2019-11-30 NOTE — Therapy (Signed)
[place]. [place], [place], [address] Phone: [phone]   Fax:  [phone]  Physical Therapy Treatment  Patient Details  Name: Donald Simmons MRN: [id number] Date of Birth: August 06, 1975 Referring Provider (PT): Brumfield   Encounter Date: 11/30/2019   PT End of Session - 11/30/19 6962    Visit Number 11    Number of Visits 27    Date for PT Re-Evaluation 01/03/20    Authorization Type Medicaid    PT Start Time 9528    PT Stop Time 1745    PT Time Calculation (min) 40 min    Activity Tolerance Patient limited by pain    Behavior During Therapy [facility] for tasks assessed/performed           Past Medical History:  Diagnosis Date  . Diabetes ([place])   . Diabetes mellitus without complication ([place])   . Frozen shoulder   . Hyperlipemia   . Hyperlipemia     History reviewed. No pertinent surgical history.  There were no vitals filed for this visit.   Subjective Assessment - 11/30/19 1706    Subjective Pt reports shoulder is very stiff, painful, and sore today.    Currently in Pain? Yes    Pain Score 9     Pain Location Shoulder    Pain Orientation Right                             [facility] Adult PT Treatment/Exercise - 11/30/19 0001      Shoulder Exercises: Standing   Other Standing Exercises wall slide flex1x5 standing; seated table abduction slide x10     Other Standing Exercises Wand AAROM flex, ext, IR up back x10 each      Shoulder Exercises: ROM/Strengthening   UBE (Upper Arm Bike) L 3 3 min fwd/3 min backward    Lat Pull 1.5 plate;10 reps    Lat Pull Limitations limited ROM; painful and guarded    Cybex Press 1.5 plate;10 reps    Cybex Row 1 plate;10 reps      Moist Heat Therapy   Number Minutes Moist Heat 12 Minutes    Moist Heat Location Shoulder      Electrical Stimulation   Electrical Stimulation Location right shld    Electrical Stimulation Action IFC     Electrical Stimulation Parameters sitting    Electrical Stimulation Goals Pain                    PT Short Term Goals - 11/01/19 1704      PT SHORT TERM GOAL #1   Title "Independent with initial HEP    Status Achieved             PT Long Term Goals - 11/16/19 1739      PT LONG TERM GOAL #1   Title "Pt will be independent with advanced HEP.     Status Partially Met      PT LONG TERM GOAL #2   Title "Pain will decrease to 3/10or less with all functional activities    Status On-going      PT LONG TERM GOAL #3   Title increase AROM of the right shoulder to 120 degrees flexion    Status Partially Met                 Plan - 11/30/19 1743    Clinical Impression Statement Pt demos  difficulty with active and AAROM this rx; reporting increased stiffness and soreness. With cueing able to complete some ex's with assistance. Pt states that his next follow up with MD is in ~2 months; would like to continue PT to try to maximize function. Push ROM and strength to tolerance.    PT Treatment/Interventions ADLs/Self Care Home Management;Electrical Stimulation;Cryotherapy;Iontophoresis 5m/ml Dexamethasone;Moist Heat;Traction;Ultrasound;Therapeutic exercise;Therapeutic activities;Patient/family education;Manual techniques;Passive range of motion    PT Next Visit Plan continue to try to gain the ROM for increase function    Consulted and Agree with Plan of Care Patient           Patient will benefit from skilled therapeutic intervention in order to improve the following deficits and impairments:  Decreased range of motion, Increased muscle spasms, Pain, Improper body mechanics, Impaired flexibility, Impaired UE functional use, Decreased strength, Postural dysfunction  Visit Diagnosis: Stiffness of right shoulder, not elsewhere classified  Acute pain of right shoulder     Problem List Patient Active Problem List   Diagnosis Date Noted  . Hyperlipidemia associated  with type 2 diabetes mellitus ([place] 11/17/2018  . Chronic bilateral low back pain without sciatica 01/20/2018  . Chronic left shoulder pain 01/20/2018  . Fatigue 01/20/2018  . Neck pain 01/20/2018  . Uncontrolled type 2 diabetes mellitus with diabetic neuropathy, with long-term current use of insulin ([place] 01/20/2018  . Uncontrolled type 2 diabetes mellitus with hyperglycemia, with long-term current use of insulin ([place] 10/05/2017  . Neuropathy 06/09/2016  . Localization-related idiopathic epilepsy and epileptic syndromes with seizures of localized onset, not intractable, without status epilepticus ([place] 02/14/2015   AAmador Cunas PT, DPT ADonald ProseSugg 11/30/2019, 5:49 PM  [place] [address]Phone: [phone]  Fax:  [phone] Name: Donald BramelMRN: [id number] of Birth: [birth year]

## 2019-12-02 ENCOUNTER — Telehealth: Payer: Self-pay | Admitting: Neurology

## 2019-12-02 NOTE — Telephone Encounter (Signed)
No answer again at 12/02/2019.

## 2019-12-02 NOTE — Telephone Encounter (Signed)
Please advise 

## 2019-12-02 NOTE — Telephone Encounter (Signed)
No answer at 1044  

## 2019-12-02 NOTE — Telephone Encounter (Signed)
Pls let him know he will be put on a waitlist, however if the episodes are occurring at specific times such as meals, this is less likely due to his brain and possibly due to his sugar levels. Pls have him contact his endocrinologist as well. Thanks

## 2019-12-02 NOTE — Telephone Encounter (Signed)
Patient sent a request wanting a virtual appointment. He stated "Intense Shakiness an hour or so after eating meals. Feels like something is going on in my head sometimes when it happens. Request video interview at earliest convenience to discuss matter and will also discuss with other members of care team"

## 2019-12-06 NOTE — Telephone Encounter (Signed)
No answer again, unable to contact patient.

## 2019-12-08 ENCOUNTER — Ambulatory Visit: Payer: Medicaid Other | Admitting: Physical Therapy

## 2019-12-13 ENCOUNTER — Encounter: Payer: Self-pay | Admitting: Physical Therapy

## 2019-12-16 ENCOUNTER — Encounter: Payer: Self-pay | Admitting: Physical Therapy

## 2019-12-20 ENCOUNTER — Ambulatory Visit: Payer: Medicaid Other | Admitting: Physical Therapy

## 2019-12-22 ENCOUNTER — Ambulatory Visit: Payer: Medicaid Other | Admitting: Physical Therapy

## 2019-12-23 ENCOUNTER — Other Ambulatory Visit: Payer: Self-pay

## 2019-12-23 ENCOUNTER — Encounter (HOSPITAL_BASED_OUTPATIENT_CLINIC_OR_DEPARTMENT_OTHER): Payer: Self-pay | Admitting: *Deleted

## 2019-12-23 ENCOUNTER — Emergency Department (HOSPITAL_BASED_OUTPATIENT_CLINIC_OR_DEPARTMENT_OTHER): Payer: Medicaid Other

## 2019-12-23 ENCOUNTER — Emergency Department (HOSPITAL_BASED_OUTPATIENT_CLINIC_OR_DEPARTMENT_OTHER)
Admission: EM | Admit: 2019-12-23 | Discharge: 2019-12-24 | Disposition: A | Discharge status: Home / Self Care | Payer: Medicaid Other | Attending: Emergency Medicine | Admitting: Emergency Medicine

## 2019-12-23 DIAGNOSIS — K5900 Constipation, unspecified: Secondary | ICD-10-CM | POA: Diagnosis not present

## 2019-12-23 DIAGNOSIS — E1169 Type 2 diabetes mellitus with other specified complication: Secondary | ICD-10-CM | POA: Diagnosis not present

## 2019-12-23 DIAGNOSIS — E785 Hyperlipidemia, unspecified: Secondary | ICD-10-CM | POA: Insufficient documentation

## 2019-12-23 DIAGNOSIS — E1140 Type 2 diabetes mellitus with diabetic neuropathy, unspecified: Secondary | ICD-10-CM | POA: Insufficient documentation

## 2019-12-23 DIAGNOSIS — Z79899 Other long term (current) drug therapy: Secondary | ICD-10-CM | POA: Insufficient documentation

## 2019-12-23 DIAGNOSIS — E1165 Type 2 diabetes mellitus with hyperglycemia: Secondary | ICD-10-CM | POA: Insufficient documentation

## 2019-12-23 DIAGNOSIS — K649 Unspecified hemorrhoids: Secondary | ICD-10-CM | POA: Diagnosis not present

## 2019-12-23 LAB — I-STAT VENOUS BLOOD GAS, ED
Acid-Base Excess: 5 mmol/L — ABNORMAL HIGH (ref 0.0–2.0)
Bicarbonate: 31.7 mmol/L — ABNORMAL HIGH (ref 20.0–28.0)
Calcium, Ion: 1.25 mmol/L (ref 1.15–1.40)
HCT: 36 % — ABNORMAL LOW (ref 39.0–52.0)
Hemoglobin: 12.2 g/dL — ABNORMAL LOW (ref 13.0–17.0)
O2 Saturation: 38 %
Patient temperature: 98
Potassium: 4 mmol/L (ref 3.5–5.1)
Sodium: 141 mmol/L (ref 135–145)
TCO2: 33 mmol/L — ABNORMAL HIGH (ref 22–32)
pCO2, Ven: 52.5 mmHg (ref 44.0–60.0)
pH, Ven: 7.388 (ref 7.250–7.430)
pO2, Ven: 22 mmHg — CL (ref 32.0–45.0)

## 2019-12-23 LAB — BASIC METABOLIC PANEL
Anion gap: 10 (ref 5–15)
BUN: 7 mg/dL (ref 6–20)
CO2: 27 mmol/L (ref 22–32)
Calcium: 8.9 mg/dL (ref 8.9–10.3)
Chloride: 100 mmol/L (ref 98–111)
Creatinine, Ser: 0.7 mg/dL (ref 0.61–1.24)
GFR, Estimated: >60 mL/min (ref >60)
Glucose, Bld: 134 mg/dL — ABNORMAL HIGH (ref 70–99)
Potassium: 3.8 mmol/L (ref 3.5–5.1)
Sodium: 137 mmol/L (ref 135–145)

## 2019-12-23 LAB — CBC
HCT: 36.2 % — ABNORMAL LOW (ref 39.0–52.0)
Hemoglobin: 11.9 g/dL — ABNORMAL LOW (ref 13.0–17.0)
MCH: 29 pg (ref 26.0–34.0)
MCHC: 32.9 g/dL (ref 30.0–36.0)
MCV: 88.3 fL (ref 80.0–100.0)
Platelets: 476 10*3/uL — ABNORMAL HIGH (ref 150–400)
RBC: 4.1 MIL/uL — ABNORMAL LOW (ref 4.22–5.81)
RDW: 13.2 % (ref 11.5–15.5)
WBC: 5.8 10*3/uL (ref 4.0–10.5)
nRBC: 0 % (ref 0.0–0.2)

## 2019-12-23 LAB — CBG MONITORING, ED: Glucose-Capillary: 134 mg/dL — ABNORMAL HIGH (ref 70–99)

## 2019-12-23 MED ORDER — SODIUM CHLORIDE 0.9 % IV SOLN: 1000.0000 mL | INTRAVENOUS | Status: DC

## 2019-12-23 MED ORDER — HYDROCODONE-ACETAMINOPHEN 5-325 MG PO TABS
1.0000 | ORAL_TABLET | ORAL | 0 refills | Status: DC | PRN
Start: 2019-12-23 — End: 2020-05-01

## 2019-12-23 MED ORDER — HYDROMORPHONE HCL 1 MG/ML IJ SOLN
1.0000 mg | Freq: Once | INTRAMUSCULAR | Status: AC
  Administered 2019-12-23: 1 mg via INTRAVENOUS
  Filled 2019-12-23: qty 1

## 2019-12-23 MED ORDER — SODIUM CHLORIDE 0.9 % IV BOLUS (SEPSIS)
1000.0000 mL | Freq: Once | INTRAVENOUS | Status: AC
  Administered 2019-12-23: 2000 mL via INTRAVENOUS
  Administered 2019-12-23: 1000 mL via INTRAVENOUS

## 2019-12-23 MED ORDER — IOHEXOL 300 MG/ML  SOLN
100.0000 mL | Freq: Once | INTRAMUSCULAR | Status: AC | PRN
  Administered 2019-12-23: 100 mL via INTRAVENOUS

## 2019-12-23 MED ORDER — LIDOCAINE 5 % EX OINT: 1.0000 "application " | TOPICAL_OINTMENT | CUTANEOUS | 0 refills | Status: DC | PRN

## 2019-12-23 MED ORDER — MORPHINE SULFATE (PF) 4 MG/ML IV SOLN
4.0000 mg | Freq: Once | INTRAVENOUS | Status: AC
  Administered 2019-12-23: 4 mg via INTRAVENOUS
  Filled 2019-12-23: qty 1

## 2019-12-23 MED ORDER — LIDOCAINE HCL URETHRAL/MUCOSAL 2 % EX GEL
1.0000 "application " | Freq: Once | CUTANEOUS | Status: AC
  Administered 2019-12-23: 1 via TOPICAL
  Filled 2019-12-23: qty 11

## 2019-12-23 MED ORDER — HYDROCORTISONE (PERIANAL) 2.5 % EX CREA: 1.0000 "application " | TOPICAL_CREAM | Freq: Two times a day (BID) | CUTANEOUS | 0 refills | Status: DC

## 2019-12-23 MED ORDER — DOCUSATE SODIUM 250 MG PO CAPS: 250.0000 mg | ORAL_CAPSULE | Freq: Every day | ORAL | 0 refills | Status: DC

## 2019-12-23 NOTE — ED Notes (Signed)
Called to lobby by registration , pt getting out of w/c and states im going to lay on the floor, instructed pt to not lay on the floor and pt assisted  to regular lobby chair

## 2019-12-23 NOTE — Discharge Instructions (Addendum)
Soak in a bath frequently.  Take the medications as prescribed.  Follow-up urgently for further evaluation

## 2019-12-23 NOTE — ED Provider Notes (Signed)
[facility] EMERGENCY DEPARTMENT Provider Note   CSN: [id number] Arrival date & time: 12/23/19  1713     History Chief Complaint  Patient presents with  . Hemorrhoids    Donald Simmons is a 44 y.o. male.  HPI   Patient presented to the ED for evaluation of rectal pain.  History was provided by the patient's wife and the patient.  Patient prefers for the wife to provide most of the history.  Patient states that started about a day ago.  Patient states he felt a large lump in the rectal area.  It is extremely painful.  He has been having trouble with constipation.  Patient denies vomiting.  No known fevers.  He went to an urgent care who states he was diagnosed with a hemorrhoid and sent to the ED. Past Medical History:  Diagnosis Date  . Diabetes (HCC)   . Diabetes mellitus without complication (HCC)   . Frozen shoulder   . Hyperlipemia   . Hyperlipemia     Patient Active Problem List   Diagnosis Date Noted  . Hyperlipidemia associated with type 2 diabetes mellitus (HCC) 11/17/2018  . Chronic bilateral low back pain without sciatica 01/20/2018  . Chronic left shoulder pain 01/20/2018  . Fatigue 01/20/2018  . Neck pain 01/20/2018  . Uncontrolled type 2 diabetes mellitus with diabetic neuropathy, with long-term current use of insulin (HCC) 01/20/2018  . Uncontrolled type 2 diabetes mellitus with hyperglycemia, with long-term current use of insulin (HCC) 10/05/2017  . Neuropathy 06/09/2016  . Localization-related idiopathic epilepsy and epileptic syndromes with seizures of localized onset, not intractable, without status epilepticus (HCC) 02/14/2015    History reviewed. No pertinent surgical history.     Family History  Problem Relation Age of Onset  . Diabetes Mother   . Thyroid disease Mother   . Hyperlipidemia Mother   . Seizures Maternal Uncle   . Diabetes Maternal Uncle     Social History   Tobacco Use  . Smoking status: Never Smoker  .  Smokeless tobacco: Never Used  Vaping Use  . Vaping Use: Never used  Substance Use Topics  . Alcohol use: Yes    Alcohol/week: 0.0 standard drinks    Comment: Occ  . Drug use: No    Home Medications Prior to Admission medications   Medication Sig Start Date End Date Taking? Authorizing Provider  Cholecalciferol (VITAMIN D3) 1.25 MG (50000 UT) CAPS TK 1 C PO 1 TIME A WK 02/27/18   [provider]  docusate sodium (COLACE) 250 MG capsule Take 1 capsule (250 mg total) by mouth daily. 12/23/19   Linwood Dibbles, MD  Eslicarbazepine Acetate (APTIOM) 400 MG TABS Take 3 tablets every night 10/11/19   Van Clines, MD  glucose blood (ACCU-CHEK AVIVA) test strip Use as instructed to check blood sugar three times daily. 08/12/17   Reather Littler, MD  HYDROcodone-acetaminophen (NORCO/VICODIN) 5-325 MG tablet Take 1 tablet by mouth every 4 (four) hours as needed. 12/23/19   Linwood Dibbles, MD  hydrocortisone (ANUSOL-HC) 2.5 % rectal cream Place 1 application rectally 2 (two) times daily. 12/23/19   Linwood Dibbles, MD  lamoTRIgine (LAMICTAL) 100 MG tablet Take 2 tablets twice a day 10/11/19   Van Clines, MD  Lancets 28G MISC 1 each by Does not apply route daily. Use to check blood sugar three times daily. 08/12/17   Reather Littler, MD  lidocaine (XYLOCAINE) 5 % ointment Apply 1 application topically as needed. 12/23/19   Linwood Dibbles,  MD    Allergies    Asa [aspirin] and Jonne Plysa [aspirin]  Review of Systems   Review of Systems  All other systems reviewed and are negative.   Physical Exam Updated Vital Signs BP (!) 128/92 (BP Location: Right Arm)   Pulse 80   Temp 98 F (36.7 C)   Resp 20   Ht 1.88 m (6\' 2" )   Wt 71.7 kg   SpO2 98%   BMI 20.29 kg/m   Physical Exam Vitals and nursing note reviewed.  Constitutional:      Appearance: He is well-developed. He is ill-appearing.     Comments: Mucous membranes appear dry  HENT:     Head: Normocephalic and atraumatic.     Right Ear: External ear  normal.     Left Ear: External ear normal.  Eyes:     General: No scleral icterus.       Right eye: No discharge.        Left eye: No discharge.     Conjunctiva/sclera: Conjunctivae normal.  Neck:     Trachea: No tracheal deviation.  Cardiovascular:     Rate and Rhythm: Normal rate.  Pulmonary:     Effort: Pulmonary effort is normal. No respiratory distress.     Breath sounds: No stridor.  Abdominal:     General: There is no distension.  Genitourinary:    Comments: Large external hemorrhoid, soft, exquisitely tender, exam extremely limited by the patient's pain and discomfort Musculoskeletal:        General: No swelling or deformity.     Cervical back: Neck supple.  Skin:    General: Skin is warm and dry.     Findings: No rash.  Neurological:     Mental Status: He is alert.     Cranial Nerves: Cranial nerve deficit: no gross deficits.     ED Results / Procedures / Treatments   Labs (all labs ordered are listed, but only abnormal results are displayed) Labs Reviewed  CBC - Abnormal; Notable for the following components:      Result Value   RBC 4.10 (*)    Hemoglobin 11.9 (*)    HCT 36.2 (*)    Platelets 476 (*)    All other components within normal limits  BASIC METABOLIC PANEL - Abnormal; Notable for the following components:   Glucose, Bld 134 (*)    All other components within normal limits  CBG MONITORING, ED - Abnormal; Notable for the following components:   Glucose-Capillary 134 (*)    All other components within normal limits  I-STAT VENOUS BLOOD GAS, ED - Abnormal; Notable for the following components:   pO2, Ven 22.0 (*)    Bicarbonate 31.7 (*)    TCO2 33 (*)    Acid-Base Excess 5.0 (*)    HCT 36.0 (*)    Hemoglobin 12.2 (*)    All other components within normal limits    EKG None  Radiology CT PELVIS W CONTRAST  Result Date: 12/23/2019 CLINICAL DATA:  Concern for anorectal abscess EXAM: CT PELVIS WITH CONTRAST TECHNIQUE: Multidetector CT  imaging of the pelvis was performed using the standard protocol following the bolus administration of intravenous contrast. CONTRAST:  100mL OMNIPAQUE IOHEXOL 300 MG/ML  SOLN COMPARISON:  CT abdomen and pelvis 12/05/2014 FINDINGS: Urinary Tract: Distal ureters and urinary bladder are unremarkable. No visible urolithiasis. No significant bladder wall thickening, calculi or debris. Bowel: High attenuation enteric contrast media partially traverses the small bowel. No evidence of obstruction  in the included bowel loops. No suspicious bowel wall thickening or dilatation. Air-filled appendix in the right lower quadrant. Lobular tissue thickening at the anorectal verge may reflect some combination of internal and external hemorrhoids with small amount of linear hyperattenuation towards the base of the gluteal cleft (7/49), possibly debris or trace hemorrhage. No organized perirectal abscess or collection is seen. No discernible fistular tracks. No abnormal involvement the mesorectal fat or ischioanal fossa. Vascular/Lymphatic: No pathologically enlarged lymph nodes. No significant vascular abnormality seen. Reproductive: Coarse eccentric calcification of the prostate. No concerning abnormalities of the prostate or seminal vesicles. Trace paratesticular fluid bilaterally, possibly within physiologic normal. No other acute or significant abnormality of the included external genitalia. Other: Changes at the anal verge, as above. No bowel containing hernias. No abdominopelvic free air or fluid. Musculoskeletal: No acute osseous abnormality or suspicious osseous lesion. Musculature appears normal and symmetric IMPRESSION: 1. Lobular tissue thickening at the anorectal verge may reflect some combination of internal and external hemorrhoids with small amount of linear hyperattenuation towards the base of the gluteal cleft (7/49), possibly debris or trace hemorrhage. No organized perirectal abscess or collection is seen. No  discernible fistular tracks. Correlate with visual inspection. Electronically Signed   By: Kreg Shropshire M.D.   On: 12/23/2019 23:25    Procedures Procedures (including critical care time)  Medications Ordered in ED Medications  sodium chloride 0.9 % bolus 1,000 mL (2,000 mLs Intravenous New Bag/Given 12/23/19 2329)    Followed by  0.9 %  sodium chloride infusion (has no administration in time range)  morphine 4 MG/ML injection 4 mg (4 mg Intravenous Given 12/23/19 2142)  lidocaine (XYLOCAINE) 2 % jelly 1 application (1 application Topical Given 12/23/19 2159)  HYDROmorphone (DILAUDID) injection 1 mg (1 mg Intravenous Given 12/23/19 2247)  iohexol (OMNIPAQUE) 300 MG/ML solution 100 mL (100 mLs Intravenous Contrast Given 12/23/19 2257)    ED Course  I have reviewed the triage vital signs and the nursing notes.  Pertinent labs & imaging results that were available during my care of the patient were reviewed by me and considered in my medical decision making (see chart for details).  Clinical Course as of Dec 23 2354  Fri Dec 23, 2019  2231 CBC normal.  Metabolic panel normal. VBG without acidosis l.   [JK]    Clinical Course User Index [JK] Linwood Dibbles, MD   MDM Rules/Calculators/A&P                          Patient presented to the ED for evaluation of severe rectal pain.  Patient has large external hemorrhoids.  Question whether there may be a component of thrombosis but the patient really did not write much of an exam.  Laboratory tests are reassuring.  No signs of acute infection.  CT scan does not show evidence of abscess.  We will plan on discharge home with medications for pain as well as stool softeners.  Discussed the importance of sitz bath.  Outpatient follow-up with general surgery. Final Clinical Impression(s) / ED Diagnoses Final diagnoses:  Hemorrhoids, unspecified hemorrhoid type    Rx / DC Orders ED Discharge Orders         Ordered    HYDROcodone-acetaminophen  (NORCO/VICODIN) 5-325 MG tablet  Every 4 hours PRN        12/23/19 2355    docusate sodium (COLACE) 250 MG capsule  Daily        12/23/19 2355  lidocaine (XYLOCAINE) 5 % ointment  As needed        12/23/19 2355    hydrocortisone (ANUSOL-HC) 2.5 % rectal cream  2 times daily        12/23/19 2355           Linwood Dibbles, MD 12/23/19 2357

## 2019-12-23 NOTE — ED Triage Notes (Signed)
C/o rectal pain x 1 day , sent here from [facility]

## 2019-12-23 NOTE — ED Notes (Signed)
Patient transported to CT 

## 2019-12-23 NOTE — ED Notes (Signed)
Pt crying, stating " pain is so bad, pain medicine wore off". Wife at bedside. MD informed. Pt tolerated po contrast.

## 2020-05-01 ENCOUNTER — Encounter: Payer: Self-pay | Admitting: Neurology

## 2020-05-01 ENCOUNTER — Telehealth (INDEPENDENT_AMBULATORY_CARE_PROVIDER_SITE_OTHER): Payer: Medicaid Other | Admitting: Neurology

## 2020-05-01 ENCOUNTER — Other Ambulatory Visit: Payer: Self-pay

## 2020-05-01 VITALS — Ht 74.0 in | Wt 170.0 lb

## 2020-05-01 DIAGNOSIS — Z5329 Procedure and treatment not carried out because of patient's decision for other reasons: Secondary | ICD-10-CM

## 2020-05-02 ENCOUNTER — Telehealth (INDEPENDENT_AMBULATORY_CARE_PROVIDER_SITE_OTHER): Payer: Medicaid Other | Admitting: Neurology

## 2020-05-02 ENCOUNTER — Other Ambulatory Visit: Payer: Self-pay

## 2020-05-02 ENCOUNTER — Encounter: Payer: Self-pay | Admitting: Neurology

## 2020-05-02 DIAGNOSIS — G40019 Localization-related (focal) (partial) idiopathic epilepsy and epileptic syndromes with seizures of localized onset, intractable, without status epilepticus: Secondary | ICD-10-CM
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
## 2020-05-19 MED ORDER — LAMOTRIGINE 100 MG PO TABS: ORAL_TABLET | ORAL | 11 refills | Status: DC

## 2020-05-19 MED ORDER — APTIOM 400 MG PO TABS: ORAL_TABLET | ORAL | 11 refills | Status: DC

## 2020-05-19 NOTE — Progress Notes (Signed)
Patient feeling unwell, rescheduled to 05/02/2020

## 2020-05-21 ENCOUNTER — Ambulatory Visit: Payer: Medicaid Other | Admitting: Neurology

## 2020-05-21 DIAGNOSIS — Z0279 Encounter for issue of other medical certificate: Secondary | ICD-10-CM

## 2020-06-01 ENCOUNTER — Emergency Department (HOSPITAL_BASED_OUTPATIENT_CLINIC_OR_DEPARTMENT_OTHER)
Admission: EM | Admit: 2020-06-01 | Discharge: 2020-06-01 | Disposition: A | Discharge status: Home / Self Care | Payer: Medicaid Other | Attending: Emergency Medicine | Admitting: Emergency Medicine

## 2020-06-01 ENCOUNTER — Emergency Department (HOSPITAL_BASED_OUTPATIENT_CLINIC_OR_DEPARTMENT_OTHER): Payer: Medicaid Other

## 2020-06-01 ENCOUNTER — Other Ambulatory Visit: Payer: Self-pay

## 2020-06-01 DIAGNOSIS — R079 Chest pain, unspecified: Secondary | ICD-10-CM | POA: Diagnosis not present

## 2020-06-01 DIAGNOSIS — E1140 Type 2 diabetes mellitus with diabetic neuropathy, unspecified: Secondary | ICD-10-CM | POA: Insufficient documentation

## 2020-06-01 LAB — CBC WITH DIFFERENTIAL/PLATELET
Abs Immature Granulocytes: 0.01 10*3/uL (ref 0.00–0.07)
Basophils Absolute: 0 10*3/uL (ref 0.0–0.1)
Basophils Relative: 0 %
Eosinophils Absolute: 0.1 10*3/uL (ref 0.0–0.5)
Eosinophils Relative: 3 %
HCT: 42.3 % (ref 39.0–52.0)
Hemoglobin: 14.6 g/dL (ref 13.0–17.0)
Immature Granulocytes: 0 %
Lymphocytes Relative: 47 %
Lymphs Abs: 2.2 10*3/uL (ref 0.7–4.0)
MCH: 30.7 pg (ref 26.0–34.0)
MCHC: 34.5 g/dL (ref 30.0–36.0)
MCV: 88.9 fL (ref 80.0–100.0)
Monocytes Absolute: 0.4 10*3/uL (ref 0.1–1.0)
Monocytes Relative: 9 %
Neutro Abs: 1.9 10*3/uL (ref 1.7–7.7)
Neutrophils Relative %: 41 %
Platelets: 241 10*3/uL (ref 150–400)
RBC: 4.76 MIL/uL (ref 4.22–5.81)
RDW: 13.6 % (ref 11.5–15.5)
WBC: 4.7 10*3/uL (ref 4.0–10.5)
nRBC: 0 % (ref 0.0–0.2)

## 2020-06-01 LAB — BASIC METABOLIC PANEL
Anion gap: 8 (ref 5–15)
BUN: 11 mg/dL (ref 6–20)
CO2: 27 mmol/L (ref 22–32)
Calcium: 9.2 mg/dL (ref 8.9–10.3)
Chloride: 101 mmol/L (ref 98–111)
Creatinine, Ser: 0.85 mg/dL (ref 0.61–1.24)
GFR, Estimated: >60 mL/min (ref >60)
Glucose, Bld: 149 mg/dL — ABNORMAL HIGH (ref 70–99)
Potassium: 3.8 mmol/L (ref 3.5–5.1)
Sodium: 136 mmol/L (ref 135–145)

## 2020-06-01 LAB — HEPATIC FUNCTION PANEL
ALT: 18 U/L (ref 0–44)
AST: 20 U/L (ref 15–41)
Albumin: 4.1 g/dL (ref 3.5–5.0)
Alkaline Phosphatase: 53 U/L (ref 38–126)
Bilirubin, Direct: 0.1 mg/dL (ref 0.0–0.2)
Indirect Bilirubin: 0.4 mg/dL (ref 0.3–0.9)
Total Bilirubin: 0.5 mg/dL (ref 0.3–1.2)
Total Protein: 7.7 g/dL (ref 6.5–8.1)

## 2020-06-01 LAB — TROPONIN I (HIGH SENSITIVITY): Troponin I (High Sensitivity): 2 ng/L (ref <18)

## 2020-06-01 LAB — LIPASE, BLOOD: Lipase: 24 U/L (ref 11–51)

## 2020-06-01 MED ORDER — LIDOCAINE VISCOUS HCL 2 % MT SOLN
15.0000 mL | Freq: Once | OROMUCOSAL | Status: AC
  Administered 2020-06-01: 15 mL via ORAL
  Filled 2020-06-01: qty 15

## 2020-06-01 MED ORDER — ALUM & MAG HYDROXIDE-SIMETH 200-200-20 MG/5ML PO SUSP
30.0000 mL | Freq: Once | ORAL | Status: AC
  Administered 2020-06-01: 30 mL via ORAL
  Filled 2020-06-01: qty 30

## 2020-06-01 MED ORDER — SUCRALFATE 1 G PO TABS: 1.0000 g | ORAL_TABLET | Freq: Three times a day (TID) | ORAL | 0 refills | Status: DC

## 2020-06-01 MED ORDER — PANTOPRAZOLE SODIUM 20 MG PO TBEC: 20.0000 mg | DELAYED_RELEASE_TABLET | Freq: Every day | ORAL | 0 refills | Status: DC

## 2020-06-01 NOTE — ED Provider Notes (Signed)
[facility] EMERGENCY DEPARTMENT Provider Note   CSN: [id number] Arrival date & time: 06/01/20  1029     History Chief Complaint  Patient presents with  . Chest Pain    Donald Simmons is a 45 y.o. male.  The history is provided by the patient.  Chest Pain Chest pain location: upper chest/throat down to the left upper abdomen. Pain quality: aching   Pain severity:  Mild Onset quality:  Gradual Duration:  1 week Timing:  Intermittent Progression:  Waxing and waning Chronicity:  New Context: at rest   Relieved by:  Nothing Worsened by:  Nothing Associated symptoms: no abdominal pain, no back pain, no cough, no fever, no palpitations, no shortness of breath and no vomiting   Risk factors: diabetes mellitus and high cholesterol        Past Medical History:  Diagnosis Date  . Diabetes (HCC)   . Diabetes mellitus without complication (HCC)   . Frozen shoulder   . Hyperlipemia   . Hyperlipemia     Patient Active Problem List   Diagnosis Date Noted  . Hyperlipidemia associated with type 2 diabetes mellitus (HCC) 11/17/2018  . Chronic bilateral low back pain without sciatica 01/20/2018  . Chronic left shoulder pain 01/20/2018  . Fatigue 01/20/2018  . Neck pain 01/20/2018  . Uncontrolled type 2 diabetes mellitus with diabetic neuropathy, with long-term current use of insulin (HCC) 01/20/2018  . Uncontrolled type 2 diabetes mellitus with hyperglycemia, with long-term current use of insulin (HCC) 10/05/2017  . Neuropathy 06/09/2016  . Localization-related idiopathic epilepsy and epileptic syndromes with seizures of localized onset, not intractable, without status epilepticus (HCC) 02/14/2015    No past surgical history on file.     Family History  Problem Relation Age of Onset  . Diabetes Mother   . Thyroid disease Mother   . Hyperlipidemia Mother   . Seizures Maternal Uncle   . Diabetes Maternal Uncle     Social History   Tobacco Use  .  Smoking status: Never Smoker  . Smokeless tobacco: Never Used  Vaping Use  . Vaping Use: Never used  Substance Use Topics  . Alcohol use: Yes    Alcohol/week: 0.0 standard drinks    Comment: Occ  . Drug use: No    Home Medications Prior to Admission medications   Medication Sig Start Date End Date Taking? Authorizing Provider  docusate sodium (COLACE) 250 MG capsule Take 1 capsule (250 mg total) by mouth daily. 12/23/19  Yes Linwood Dibbles, MD  glucose blood (ACCU-CHEK AVIVA) test strip Use as instructed to check blood sugar three times daily. 08/12/17  Yes Reather Littler, MD  Lancets 28G MISC 1 each by Does not apply route daily. Use to check blood sugar three times daily. 08/12/17  Yes Reather Littler, MD  pantoprazole (PROTONIX) 20 MG tablet Take 1 tablet (20 mg total) by mouth daily for 14 days. 06/01/20 06/15/20 Yes Ivor Kishi, DO  sucralfate (CARAFATE) 1 g tablet Take 1 tablet (1 g total) by mouth 4 (four) times daily -  with meals and at bedtime for 14 days. 06/01/20 06/15/20 Yes Bayard More, DO  Eslicarbazepine Acetate (APTIOM) 400 MG TABS Take 3 tablets every night 05/19/20   Van Clines, MD  lamoTRIgine (LAMICTAL) 100 MG tablet Take 2 tablets twice a day 05/19/20   Van Clines, MD    Allergies    Jonne Ply [aspirin] and Jonne Ply [aspirin]  Review of Systems   Review of Systems  Constitutional: Negative  for chills and fever.  HENT: Negative for ear pain and sore throat.   Eyes: Negative for pain and visual disturbance.  Respiratory: Negative for cough and shortness of breath.   Cardiovascular: Positive for chest pain. Negative for palpitations.  Gastrointestinal: Negative for abdominal pain and vomiting.  Genitourinary: Negative for dysuria and hematuria.  Musculoskeletal: Negative for arthralgias and back pain.  Skin: Negative for color change and rash.  Neurological: Negative for seizures and syncope.  All other systems reviewed and are negative.   Physical Exam Updated Vital  Signs BP (!) 131/102 (BP Location: Right Arm)   Pulse 80   Temp 98.4 F (36.9 C) (Oral)   Resp 15   Ht 6\' 2"  (1.88 m)   Wt 81.6 kg   SpO2 99%   BMI 23.11 kg/m   Physical Exam Vitals and nursing note reviewed.  Constitutional:      General: Donald Simmons is not in acute distress.    Appearance: Donald Simmons is well-developed. Donald Simmons is not ill-appearing.  HENT:     Head: Normocephalic and atraumatic.  Eyes:     Conjunctiva/sclera: Conjunctivae normal.     Pupils: Pupils are equal, round, and reactive to light.  Cardiovascular:     Rate and Rhythm: Normal rate and regular rhythm.     Pulses:          Radial pulses are 2+ on the right side and 2+ on the left side.     Heart sounds: Normal heart sounds. No murmur heard.   Pulmonary:     Effort: Pulmonary effort is normal. No respiratory distress.     Breath sounds: Normal breath sounds. No decreased breath sounds, wheezing or rhonchi.  Abdominal:     Palpations: Abdomen is soft.     Tenderness: There is no abdominal tenderness.  Musculoskeletal:        General: Normal range of motion.     Cervical back: Normal range of motion and neck supple.     Right lower leg: No edema.     Left lower leg: No edema.  Skin:    General: Skin is warm and dry.     Capillary Refill: Capillary refill takes less than 2 seconds.  Neurological:     General: No focal deficit present.     Mental Status: Donald Simmons is alert and oriented to person, place, and time.  Psychiatric:        Mood and Affect: Mood normal.     ED Results / Procedures / Treatments   Labs (all labs ordered are listed, but only abnormal results are displayed) Labs Reviewed  BASIC METABOLIC PANEL - Abnormal; Notable for the following components:      Result Value   Glucose, Bld 149 (*)    All other components within normal limits  CBC WITH DIFFERENTIAL/PLATELET  HEPATIC FUNCTION PANEL  LIPASE, BLOOD  TROPONIN I (HIGH SENSITIVITY)    EKG EKG Interpretation  Date/Time:  Friday June 01 2020  10:39:33 EDT Ventricular Rate:  78 PR Interval:  189 QRS Duration: 100 QT Interval:  351 QTC Calculation: 400 R Axis:   74 Text Interpretation: Sinus rhythm Consider right atrial enlargement Probable left ventricular hypertrophy Confirmed by [id number] ([id number]) on 06/01/2020 10:42:10 AM   Radiology DG Chest Portable 1 View  Result Date: 06/01/2020 CLINICAL DATA:  Chest pain for 1 week. EXAM: PORTABLE CHEST 1 VIEW COMPARISON:  03/30/2015 FINDINGS: The cardiomediastinal silhouette is within normal limits for portable AP technique. No airspace consolidation, edema, pleural  effusion, pneumothorax is identified. No acute osseous abnormality is identified. IMPRESSION: No active disease. Electronically Signed   By: Sebastian Ache M.D.   On: 06/01/2020 11:11    Procedures Procedures   Medications Ordered in ED Medications  alum & mag hydroxide-simeth (MAALOX/MYLANTA) 200-200-20 MG/5ML suspension 30 mL (30 mLs Oral Given 06/01/20 1108)    And  lidocaine (XYLOCAINE) 2 % viscous mouth solution 15 mL (15 mLs Oral Given 06/01/20 1108)    ED Course  I have reviewed the triage vital signs and the nursing notes.  Pertinent labs & imaging results that were available during my care of the patient were reviewed by me and considered in my medical decision making (see chart for details).    MDM Rules/Calculators/A&P                          Gared Gillie is here with upper chest pain that radiates into the belly.  Normal vitals.  No fever.  Pain pretty persistent for the past week.  Does not think Donald Simmons gets much worse with eating or drinking.  No shortness of breath.  Overall atypical story for ACS but will check troponin.  EKG shows sinus rhythm.  No ischemic changes.  Patient is PERC negative and no concern for PE.  Will check for infection.  Will check for pancreatitis.  Will give GI cocktail.  Could be reflux related symptoms.  Lab work unremarkable.  Lipase normal doubt pancreatitis.  Troponin  normal doubt ACS.  Chest x-ray without signs of infection.  No significant anemia, electrolyte abnormality, kidney injury otherwise.  Overall suspect GI related pain.  Will start on reflux medications.  Recommend follow-up with primary care doctor.  Discharged in good condition.  This chart was dictated using voice recognition software.  Despite best efforts to proofread,  errors can occur which can change the documentation meaning.   Final Clinical Impression(s) / ED Diagnoses Final diagnoses:  Nonspecific chest pain    Rx / DC Orders ED Discharge Orders         Ordered    sucralfate (CARAFATE) 1 g tablet  3 times daily with meals & bedtime        06/01/20 1136    pantoprazole (PROTONIX) 20 MG tablet  Daily        06/01/20 1136           [place], DO 06/01/20 1137

## 2020-06-01 NOTE — ED Notes (Signed)
Pt has call bell, ed process explained  Lights turned down PPQ

## 2020-06-01 NOTE — ED Triage Notes (Signed)
CP one week 7/10 No meds taken for pain  medications reviewed in chart  Denies N/V/D headache

## 2020-06-01 NOTE — Discharge Instructions (Addendum)
Follow up with your primary care doctor.

## 2021-04-23 ENCOUNTER — Other Ambulatory Visit: Payer: Self-pay

## 2021-04-23 ENCOUNTER — Ambulatory Visit: Payer: Medicaid Other | Attending: Physician Assistant | Admitting: Physical Therapy

## 2021-04-23 ENCOUNTER — Encounter: Payer: Self-pay | Admitting: Physical Therapy

## 2021-04-23 DIAGNOSIS — M6281 Muscle weakness (generalized): Secondary | ICD-10-CM | POA: Insufficient documentation

## 2021-04-23 DIAGNOSIS — M25611 Stiffness of right shoulder, not elsewhere classified: Secondary | ICD-10-CM | POA: Diagnosis present

## 2021-04-23 DIAGNOSIS — M25511 Pain in right shoulder: Secondary | ICD-10-CM | POA: Diagnosis present

## 2021-04-23 NOTE — Therapy (Signed)
[facility] ?[address] ?[place], [place], [address] ?Phone: [phone]   Fax:  [phone] ? ?Physical Therapy Evaluation ? ?Patient Details  ?Name: Donald Simmons ?MRN: [id number] ?Date of Birth: 10-19-1975 ?Referring Provider (PT): Donald Simmons ? ? ?Encounter Date: 04/23/2021 ? ? PT End of Session - 04/23/21 1621   ? ? Visit Number 1   ? Number of Visits 4   ? Date for PT Re-Evaluation 05/21/21   ? Authorization Type Medicaid   ? Authorization Time Period 04/23/21 to 06/18/21   ? PT Start Time 1535   ? PT Stop Time 1613   ? PT Time Calculation (min) 38 min   ? Activity Tolerance Patient tolerated treatment well   ? Behavior During Therapy [facility] for tasks assessed/performed   ? ?  ?  ? ?  ? ? ?Past Medical History:  ?Diagnosis Date  ? Diabetes (HCC)   ? Diabetes mellitus without complication (HCC)   ? Frozen shoulder   ? Hyperlipemia   ? Hyperlipemia   ? ? ?History reviewed. No pertinent surgical history. ? ?There were no vitals filed for this visit. ? ? ? Subjective Assessment - 04/23/21 1537   ? ? Subjective My shoulder hasn't stopped hurting. I don't know what's hard for me to do, it comes and goes in terms of levels of pain and degrees. It varies. I had full blown frozen shoulder before which never truly resolved. I'm afraid to do certain things like lift a gallon of water.   ? Patient Stated Goals be able to use arm for basic household things, reach overhead   ? Currently in Pain? Yes   ? Pain Score 7    ? Pain Location Shoulder   ? Pain Orientation Right   ? Pain Descriptors / Indicators Tightness;Spasm   ? Pain Type Chronic pain   ? Pain Radiating Towards none   ? Pain Onset More than a month ago   ? Pain Frequency Intermittent   ? Aggravating Factors  lifting things like jugs of water, small little repeated things   ? Pain Relieving Factors at rest   ? Effect of Pain on Daily Activities severe   ? ?  ?  ? ?  ? ? ? ? ? [facility] PT Assessment - 04/23/21 0001    ? ?  ? Assessment  ? Medical Diagnosis adhesive capsulitis   ? Referring Provider (PT) Donald Simmons   ? Onset Date/Surgical Date --   chronic  ? Next MD Visit Donald Simmons   ? Prior Therapy PT here   ?  ? Precautions  ? Precautions None   ? Precaution Comments epilepsy   ?  ? Restrictions  ? Weight Bearing Restrictions No   ?  ? Balance Screen  ? Has the patient fallen in the past 6 months No   ? Has the patient had a decrease in activity level because of a fear of falling?  No   ? Is the patient reluctant to leave their home because of a fear of falling?  No   ?  ? Home Environment  ? Living Environment Private residence   ?  ? Prior Function  ? Level of Independence Independent;Independent with basic ADLs   ? Vocation Unemployed   ?  ? Observation/Other Assessments  ? Focus on Therapeutic Outcomes (FOTO)  55   ?  ? Posture/Postural Control  ? Posture/Postural Control Postural limitations   ? Postural Limitations Rounded Shoulders;Forward  head;Increased thoracic kyphosis   ?  ? AROM  ? Right Shoulder Flexion 128 Degrees   ? Right Shoulder ABduction 67 Degrees   ? Right Shoulder Internal Rotation 60 Degrees   ? Right Shoulder External Rotation 40 Degrees   ?  ? PROM  ? Right Shoulder Flexion 128 Degrees   ? Right Shoulder ABduction 90 Degrees   ? Right Shoulder Internal Rotation 60 Degrees   ? Right Shoulder External Rotation 40 Degrees   ?  ? Strength  ? Strength Assessment Site Shoulder;Elbow   ? Right/Left Shoulder Right   ? Right Shoulder Flexion 3-/5   ? Right Shoulder Extension 3+/5   ? Right Shoulder ABduction 2+/5   ? Right Shoulder Internal Rotation 4-/5   ? Right Shoulder External Rotation 3+/5   ? Right/Left Elbow Right   ? Right Elbow Flexion 4/5   ? Right Elbow Extension 4/5   ? ?  ?  ? ?  ? ? ? ? ? ? ? ? ? ? ? ? ? ?Objective measurements completed on examination: See above findings.  ? ? ? ? ? [facility] Adult PT Treatment/Exercise - 04/23/21 0001   ? ?  ? Shoulder Exercises: Supine  ? Other Supine Exercises  shoulder flexoin and ABD table top stretches 5x10 seconds   ?  ? Shoulder Exercises: Seated  ? Other Seated Exercises doorway ER stretch 5x10 seconds   ?  ? Shoulder Exercises: Standing  ? Other Standing Exercises scapular retraction red TB 1x5   ? ?  ?  ? ?  ? ? ? ? ? ? ? ? ? ? PT Education - 04/23/21 1620   ? ? Education Details exam findings, POC, HEP   ? Person(s) Educated Patient   ? Methods Explanation;Demonstration;Handout   ? Comprehension Verbalized understanding;Returned demonstration   ? ?  ?  ? ?  ? ? ? PT Short Term Goals - 04/23/21 1624   ? ?  ? PT SHORT TERM GOAL #1  ? Title Will be independent with appropriate HEP   ? Time 4   ? Period Weeks   ? Status New   ? Target Date 05/21/21   ?  ? PT SHORT TERM GOAL #2  ? Title R shoulder flexion and abduction to have improved by 20 degrees   ? Time 4   ? Period Weeks   ? Status New   ?  ? PT SHORT TERM GOAL #3  ? Title R shoulder ER ROM to have improved by 10 degrees   ? Time 4   ? Period Weeks   ? Status New   ?  ? PT SHORT TERM GOAL #4  ? Title Will have better awareness of postural and ergonomic mechanics   ? Time 4   ? Period Weeks   ? Status New   ? ?  ?  ? ?  ? ? ? ? PT Long Term Goals - 04/23/21 1629   ? ?  ? PT LONG TERM GOAL #1  ? Title MMT to have improved by 1 grade in all weak groups   ? Time 8   ? Period Weeks   ? Status New   ? Target Date 06/18/21   ?  ? PT LONG TERM GOAL #2  ? Title Pain to be no more than 4/10 at worst in R shoulder   ? Time 8   ? Period Weeks   ? Status New   ?  ?  PT LONG TERM GOAL #3  ? Title Will be able to reach overhead with pain no more than 4/10 in R shoulder and no compensation patterns   ? Time 8   ? Period Weeks   ? Status New   ?  ? PT LONG TERM GOAL #4  ? Title FOTO score to improve by at least 10 points to show improvement in functional status   ? Time 8   ? Period Weeks   ? Status New   ? ?  ?  ? ?  ? ? ? ? ? ? ? ? ? Plan - 04/23/21 1621   ? ? Clinical Impression Statement Donald Simmons arrives doing OK today,  he?s been having a lot of issues with R shoulder pain that hasn?t gotten a lot better since he was seen here in 2021. He?s tried to keep up with some exercises but sounds like this has been hard for him. Exam still shows postural impairments, limited R shoulder AROM and PROM, and severe strength limitations in RUE in general. His ROM is a bit better than when he was examined for this problem a couple of years ago but he does seem to have quite a bit of pec tightness and guarding/compensation patterns. Will benefit from trial of skilled PT services to attempt to address functional deficits.   ? Personal Factors and Comorbidities Time since onset of injury/illness/exacerbation   ? Examination-Activity Limitations Reach Overhead;Carry;Sleep;Dressing;Hygiene/Grooming   ? Examination-Participation Restrictions Cleaning;Community Activity;Driving;Interpersonal Relationship;Shop;Laundry;[employer] Work   ? Stability/Clinical Decision Making Stable/Uncomplicated   ? Clinical Decision Making Low   ? Rehab Potential Fair   ? PT Frequency 1x / week   ? PT Duration 4 weeks   extended cert into may to allow for schedule flexibilty  ? PT Treatment/Interventions ADLs/Self Care Home Management;Electrical Stimulation;Cryotherapy;Iontophoresis 4mg /ml Dexamethasone;Moist Heat;Traction;Ultrasound;Therapeutic exercise;Therapeutic activities;Patient/family education;Manual techniques;Passive range of motion;Dry needling;Taping;Vasopneumatic Device   ? PT Next Visit Plan focus on improving ROM and function   ? PT Home Exercise Plan [id number]   ? Consulted and Agree with Plan of Care Patient   ? ?  ?  ? ?  ? ? ?Patient will benefit from skilled therapeutic intervention in order to improve the following deficits and impairments:  Decreased range of motion, Increased muscle spasms, Pain, Improper body mechanics, Impaired flexibility, Impaired UE functional use, Decreased strength, Postural dysfunction, Increased fascial restricitons,  Hypomobility ? ?Visit Diagnosis: ?Stiffness of right shoulder, not elsewhere classified ? ?Acute pain of right shoulder ? ?Muscle weakness (generalized) ? ? ? ? ?Problem List ?Patient Active Problem List  ? Diagnosis

## 2021-05-15 ENCOUNTER — Ambulatory Visit: Payer: Medicaid Other | Attending: Physician Assistant | Admitting: Physical Therapy

## 2021-05-15 ENCOUNTER — Encounter: Payer: Self-pay | Admitting: Physical Therapy

## 2021-05-15 DIAGNOSIS — M25511 Pain in right shoulder: Secondary | ICD-10-CM | POA: Diagnosis present

## 2021-05-15 DIAGNOSIS — M6281 Muscle weakness (generalized): Secondary | ICD-10-CM | POA: Insufficient documentation

## 2021-05-15 DIAGNOSIS — M25611 Stiffness of right shoulder, not elsewhere classified: Secondary | ICD-10-CM | POA: Diagnosis present
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
## 2021-05-30 ENCOUNTER — Ambulatory Visit: Payer: Medicaid Other | Admitting: Physical Therapy

## 2021-05-30 ENCOUNTER — Encounter: Payer: Self-pay | Admitting: Physical Therapy

## 2021-05-30 DIAGNOSIS — M25611 Stiffness of right shoulder, not elsewhere classified: Secondary | ICD-10-CM

## 2021-05-30 DIAGNOSIS — M25511 Pain in right shoulder: Secondary | ICD-10-CM

## 2021-05-30 DIAGNOSIS — M6281 Muscle weakness (generalized): Secondary | ICD-10-CM

## 2021-05-30 NOTE — Therapy (Signed)
[place] ?[facility] ?[address] ?[place], [place], [address] ?Phone: [phone]   Fax:  [phone] ? ?Physical Therapy Treatment ? ?Patient Details  ?Name: Donald Simmons ?MRN: [id number] ?Date of Birth: May 12, 1975 ?Referring Provider (PT): Misty Stanley ? ? ?Encounter Date: 05/30/2021 ? ? PT End of Session - 05/30/21 1742   ? ? Visit Number 3   ? Number of Visits 4   ? Date for PT Re-Evaluation 05/21/21   ? Authorization Type Medicaid   ? Authorization Time Period 04/23/21 to 06/18/21   ? PT Start Time 0501   ? PT Stop Time 0542   ? PT Time Calculation (min) 41 min   ? Activity Tolerance Patient tolerated treatment well   ? Behavior During Therapy [facility] for tasks assessed/performed   ? ?  ?  ? ?  ? ? ?Past Medical History:  ?Diagnosis Date  ? Diabetes (HCC)   ? Diabetes mellitus without complication (HCC)   ? Frozen shoulder   ? Hyperlipemia   ? Hyperlipemia   ? ? ?History reviewed. No pertinent surgical history. ? ?There were no vitals filed for this visit. ? ? Subjective Assessment - 05/30/21 1704   ? ? Subjective I'm good. My pain has increased due to the rain because of my arthritis.   ? Patient Stated Goals be able to use arm for basic household things, reach overhead   ? Currently in Pain? Yes   ? Pain Score 7    ? Pain Location Shoulder   ? Pain Orientation Right   ? ?  ?  ? ?  ? ? ? ? ? ? ? ? ? ? ? ? ? ? ? ? ? ? ? ? [facility] Adult PT Treatment/Exercise - 05/30/21 0001   ? ?  ? Shoulder Exercises: Standing  ? Other Standing Exercises shoulder flex and ext 1# cane x10; overhead flexion yellow weight ball x10   ? Other Standing Exercises diaganols x10 up and down green TB; horizontal abd x10 green TB; shrugs 5# B UE   ?  ? Manual Therapy  ? Manual therapy comments AAROM shoulder flex, abd, and ER   ? Passive ROM shoulder flex, abd, IR, and ER   ? ?  ?  ? ?  ? ? ? ? ? ? ? ? ? ? ? ? PT Short Term Goals - 05/30/21 1746   ? ?  ? PT SHORT TERM GOAL #1  ? Title Will be  independent with appropriate HEP   ? Time 4   ? Period Weeks   ? Status Achieved   ? Target Date 05/21/21   ?  ? PT SHORT TERM GOAL #2  ? Title R shoulder flexion and abduction to have improved by 20 degrees   ? Time 4   ? Period Weeks   ? Status On-going   ?  ? PT SHORT TERM GOAL #3  ? Title R shoulder ER ROM to have improved by 10 degrees   ? Time 4   ? Period Weeks   ? Status On-going   ?  ? PT SHORT TERM GOAL #4  ? Title Will have better awareness of postural and ergonomic mechanics   ? Time 4   ? Period Weeks   ? Status On-going   ? ?  ?  ? ?  ? ? ? ? PT Long Term Goals - 05/30/21 1746   ? ?  ? PT LONG TERM GOAL #2  ?  Title Pain to be no more than 4/10 at worst in R shoulder   ? Time 8   ? Period Weeks   ? Status On-going   ?  ? PT LONG TERM GOAL #3  ? Title Will be able to reach overhead with pain no more than 4/10 in R shoulder and no compensation patterns   ? Time 8   ? Period Weeks   ? Status On-going   ? ?  ?  ? ?  ? ? ? ? ? ? ? ? Plan - 05/30/21 1743   ? ? Clinical Impression Statement Patient came in doing well. Patient was able to go further during passive stretching when distracted. He is still guarded w/ L UE but educated him to use it more to prevent further tightness. Vc's needed for correct posture during overhead flexion. Educated patient about AAROM flexion, abd, and ER for correct movements.   ? Personal Factors and Comorbidities Time since onset of injury/illness/exacerbation   ? Examination-Activity Limitations Reach Overhead;Carry;Sleep;Dressing;Hygiene/Grooming   ? Examination-Participation Restrictions Cleaning;Community Activity;Driving;Interpersonal Relationship;Shop;Laundry;Pincus Badder Work   ? Stability/Clinical Decision Making Stable/Uncomplicated   ? Clinical Decision Making Low   ? Rehab Potential Fair   ? PT Frequency 1x / week   ? PT Duration 4 weeks   ? PT Treatment/Interventions ADLs/Self Care Home Management;Electrical Stimulation;Cryotherapy;Iontophoresis 4mg /ml Dexamethasone;Moist  Heat;Traction;Ultrasound;Therapeutic exercise;Therapeutic activities;Patient/family education;Manual techniques;Passive range of motion;Dry needling;Taping;Vasopneumatic Device   ? PT Next Visit Plan focus on improving ROM and function   ? PT Home Exercise Plan [id number]   ? Consulted and Agree with Plan of Care Patient   ? ?  ?  ? ?  ? ? ?Patient will benefit from skilled therapeutic intervention in order to improve the following deficits and impairments:  Decreased range of motion, Increased muscle spasms, Pain, Improper body mechanics, Impaired flexibility, Impaired UE functional use, Decreased strength, Postural dysfunction, Increased fascial restricitons, Hypomobility ? ?Visit Diagnosis: ?Stiffness of right shoulder, not elsewhere classified ? ?Acute pain of right shoulder ? ?Muscle weakness (generalized) ? ? ? ? ?Problem List ?Patient Active Problem List  ? Diagnosis Date Noted  ? Hyperlipidemia associated with type 2 diabetes mellitus (HCC) 11/17/2018  ? Chronic bilateral low back pain without sciatica 01/20/2018  ? Chronic left shoulder pain 01/20/2018  ? Fatigue 01/20/2018  ? Neck pain 01/20/2018  ? Uncontrolled type 2 diabetes mellitus with diabetic neuropathy, with long-term current use of insulin 01/20/2018  ? Uncontrolled type 2 diabetes mellitus with hyperglycemia, with long-term current use of insulin (HCC) 10/05/2017  ? Neuropathy 06/09/2016  ? Localization-related idiopathic epilepsy and epileptic syndromes with seizures of localized onset, not intractable, without status epilepticus (HCC) 02/14/2015  ? ? ?Donald Simmons 04/14/2015 ?05/30/2021, 5:47 PM ? ?[place] ?[facility] ?[address] ?[place], [place], [place] ?Phone: [phone]   Fax:  [phone] ? ?Name: Donald Simmons ?MRN: Lowella Fairy ?Date of Birth: [birth year] ? ? ? ?

## 2021-06-06 ENCOUNTER — Ambulatory Visit: Payer: Medicaid Other | Attending: Physician Assistant | Admitting: Physical Therapy

## 2021-06-06 ENCOUNTER — Encounter: Payer: Self-pay | Admitting: Physical Therapy

## 2021-06-06 DIAGNOSIS — M6281 Muscle weakness (generalized): Secondary | ICD-10-CM | POA: Insufficient documentation

## 2021-06-06 DIAGNOSIS — M25511 Pain in right shoulder: Secondary | ICD-10-CM | POA: Diagnosis present

## 2021-06-06 DIAGNOSIS — M25611 Stiffness of right shoulder, not elsewhere classified: Secondary | ICD-10-CM | POA: Diagnosis present

## 2021-06-06 NOTE — Therapy (Signed)
[place] ?[place] [address] ?[place], [place], [address] ?Phone: [phone]   Fax:  [phone] ? ?Physical Therapy Treatment ? ?Patient Details  ?Name: Donald Simmons ?MRN: [id number] ?Date of Birth: 01/14/1976 ?Referring Provider (PT): Daisy Lazar ? ? ?Encounter Date: 06/06/2021 ? ? PT End of Session - 06/06/21 1716   ? ? Visit Number 4   ? Number of Visits 4   ? Date for PT Re-Evaluation 05/21/21   ? Authorization Type Medicaid   ? Authorization Time Period 04/23/21 to 06/18/21   ? PT Start Time 1615   ? PT Stop Time 1540   ? PT Time Calculation (min) 43 min   ? Activity Tolerance Patient tolerated treatment well   ? Behavior During Therapy [facility] for tasks assessed/performed   ? ?  ?  ? ?  ? ? ?Past Medical History:  ?Diagnosis Date  ? Diabetes ([place])   ? Diabetes mellitus without complication ([place])   ? Frozen shoulder   ? Hyperlipemia   ? Hyperlipemia   ? ? ?History reviewed. No pertinent surgical history. ? ?There were no vitals filed for this visit. ? ? Subjective Assessment - 06/06/21 1620   ? ? Subjective I'm good. My arthritis is doing well surprisingly. I haven't been stretched that much.   ? Patient Stated Goals be able to use arm for basic household things, reach overhead   ? Currently in Pain? No/denies   ? Pain Location Shoulder   ? Pain Orientation Right   ? Pain Descriptors / Indicators Aching   ? Pain Type Chronic pain   ? ?  ?  ? ?  ? ? ? ? ? [facility] PT Assessment - 06/06/21 0001   ? ?  ? AROM  ? Right Shoulder Flexion 145 Degrees   ? Right Shoulder ABduction 122 Degrees   ? Right Shoulder Internal Rotation 76 Degrees   ? Right Shoulder External Rotation 40 Degrees   ? ?  ?  ? ?  ? ? ? ? ? ? ? ? ? ? ? ? ? ? ? ? [place] Adult PT Treatment/Exercise - 06/06/21 0001   ? ?  ? Shoulder Exercises: Seated  ? Other Seated Exercises UBE lvl 3; 60mns/3 mins backward   ?  ? Shoulder Exercises: Standing  ? Other Standing Exercises shoulder flex and ext 2x10 w/ 5# cane    ? Other Standing Exercises shrugs 2x10 6# dumbells; bicep curls w/ 5# cane 2x10; diagonals 1x10 up and down red TB; horizontal abd 1x10 red TB   ?  ? Manual Therapy  ? Manual Therapy Passive ROM   ? Passive ROM shoulder flex, shoulder abd, shoulder ER and IR 30 secs x 3   ? ?  ?  ? ?  ? ? ? ? ? ? ? ? ? ? ? ? PT Short Term Goals - 06/06/21 1712   ? ?  ? PT SHORT TERM GOAL #1  ? Title Will be independent with appropriate HEP   ? Time 4   ? Period Weeks   ? Status Achieved   ? Target Date 05/21/21   ?  ? PT SHORT TERM GOAL #2  ? Title R shoulder flexion and abduction to have improved by 20 degrees   ? Baseline needs 2 more degrees w/ R shoulder flexion   ? Time 4   ? Period Weeks   ? Status Partially Met   ?  ? PT SHORT TERM  GOAL #3  ? Title R shoulder ER ROM to have improved by 10 degrees   ? Time 4   ? Period Weeks   ? Status On-going   ?  ? PT SHORT TERM GOAL #4  ? Title Will have better awareness of postural and ergonomic mechanics   ? Time 4   ? Period Weeks   ? Status On-going   ? ?  ?  ? ?  ? ? ? ? PT Long Term Goals - 06/06/21 1715   ? ?  ? PT LONG TERM GOAL #1  ? Title MMT to have improved by 1 grade in all weak groups   ? Time 8   ? Period Weeks   ? Status On-going   ? Target Date 06/18/21   ?  ? PT LONG TERM GOAL #2  ? Title Pain to be no more than 4/10 at worst in R shoulder   ? Time 8   ? Period Weeks   ? Status On-going   ?  ? PT LONG TERM GOAL #3  ? Title Will be able to reach overhead with pain no more than 4/10 in R shoulder and no compensation patterns   ? Time 8   ? Period Weeks   ? Status On-going   ?  ? PT LONG TERM GOAL #4  ? Title FOTO score to improve by at least 10 points to show improvement in functional status   ? Time 8   ? Period Weeks   ? Status On-going   ?  ? PT LONG TERM GOAL #5  ? Title increase right shoulder IR to 55 degrees   ? Baseline 77 degrees   ? Time 12   ? Period Weeks   ? Status Achieved   ? ?  ?  ? ?  ? ? ? ? ? ? ? ? Plan - 06/06/21 1717   ? ? Clinical Impression  Statement Patient came in doing well. He was compliant and well overall. Went down to a red TB instead of green TB w/ diagonals to get more shoulder motion. VC's needed for correct posture w/ shoulder flexion w/ cane.   ? Personal Factors and Comorbidities Time since onset of injury/illness/exacerbation   ? Examination-Activity Limitations Reach Overhead;Carry;Sleep;Dressing;Hygiene/Grooming   ? Examination-Participation Restrictions Cleaning;Community Activity;Driving;Interpersonal Relationship;Shop;Laundry;[employer] Work   ? Stability/Clinical Decision Making Stable/Uncomplicated   ? Clinical Decision Making Low   ? Rehab Potential Fair   ? PT Frequency 1x / week   ? PT Duration 4 weeks   ? PT Treatment/Interventions ADLs/Self Care Home Management;Electrical Stimulation;Cryotherapy;Iontophoresis 54m/ml Dexamethasone;Moist Heat;Traction;Ultrasound;Therapeutic exercise;Therapeutic activities;Patient/family education;Manual techniques;Passive range of motion;Dry needling;Taping;Vasopneumatic Device   ? PT Next Visit Plan Take MMT measurements, patient was curious as to how much he has improved.   ? PT Home Exercise Plan [id number]   ? Consulted and Agree with Plan of Care Patient   ? ?  ?  ? ?  ? ? ?Patient will benefit from skilled therapeutic intervention in order to improve the following deficits and impairments:  Decreased range of motion, Increased muscle spasms, Pain, Improper body mechanics, Impaired flexibility, Impaired UE functional use, Decreased strength, Postural dysfunction, Increased fascial restricitons, Hypomobility ? ?Visit Diagnosis: ?Stiffness of right shoulder, not elsewhere classified ? ?Acute pain of right shoulder ? ?Muscle weakness (generalized) ? ? ? ? ?Problem List ?Patient Active Problem List  ? Diagnosis Date Noted  ? Hyperlipidemia associated with type 2 diabetes mellitus ([place] 11/17/2018  ? Chronic  bilateral low back pain without sciatica 01/20/2018  ? Chronic left shoulder pain 01/20/2018  ?  Fatigue 01/20/2018  ? Neck pain 01/20/2018  ? Uncontrolled type 2 diabetes mellitus with diabetic neuropathy, with long-term current use of insulin 01/20/2018  ? Uncontrolled type 2 diabetes mellitus with hyperglycemia, with long-term current use of insulin ([place]) 10/05/2017  ? Neuropathy 06/09/2016  ? Localization-related idiopathic epilepsy and epileptic syndromes with seizures of localized onset, not intractable, without status epilepticus ([place]) 02/14/2015  ? ? ?Maeci Kalbfleisch Chauncey Cruel ?06/06/2021, 5:20 PM ? ?[place] ?[address] ?[place], [place], [address] ?Phone: [phone]   Fax:  [phone] ? ?Name: Burke Terry ?MRN: [id number] ?Date of Birth: [birth year] ? ? ? ?

## 2021-06-18 ENCOUNTER — Ambulatory Visit: Payer: Medicaid Other | Admitting: Physical Therapy

## 2021-06-18 ENCOUNTER — Encounter: Payer: Self-pay | Admitting: Physical Therapy

## 2021-06-18 DIAGNOSIS — M25611 Stiffness of right shoulder, not elsewhere classified: Secondary | ICD-10-CM

## 2021-06-18 DIAGNOSIS — M25511 Pain in right shoulder: Secondary | ICD-10-CM

## 2021-06-18 DIAGNOSIS — M6281 Muscle weakness (generalized): Secondary | ICD-10-CM

## 2021-06-18 NOTE — Therapy (Signed)
[place] ?[place] [address] ?[place], [place], [address] ?Phone: [phone]   Fax:  [phone] ? ?Physical Therapy Treatment ? ?Patient Details  ?Name: Donald Simmons ?MRN: [id number] ?Date of Birth: 09-30-1975 ?Referring Provider (PT): Daisy Lazar ? ? ?Encounter Date: 06/18/2021 ? ? PT End of Session - 06/18/21 1702   ? ? Visit Number 5   ? Number of Visits 17   ? Date for PT Re-Evaluation 08/13/21   ? Authorization Type Medicaid   ? Authorization Time Period 04/23/21 to 06/18/21; extended to 06/18/21 to 08/13/21   ? PT Start Time 1623   arrived late  ? PT Stop Time 0932   ? PT Time Calculation (min) 35 min   ? Activity Tolerance Patient tolerated treatment well   ? Behavior During Therapy [facility] for tasks assessed/performed   ? ?  ?  ? ?  ? ? ?Past Medical History:  ?Diagnosis Date  ? Diabetes ([place])   ? Diabetes mellitus without complication ([place])   ? Frozen shoulder   ? Hyperlipemia   ? Hyperlipemia   ? ? ?History reviewed. No pertinent surgical history. ? ?There were no vitals filed for this visit. ? ? Subjective Assessment - 06/18/21 1624   ? ? Subjective I'm stiff today. I still feel like its weak. I don't have much strength.   ? Patient Stated Goals be able to use arm for basic household things, reach overhead   ? Currently in Pain? No/denies   ? ?  ?  ? ?  ? ? ? ? ? [facility] PT Assessment - 06/18/21 0001   ? ?  ? Assessment  ? Medical Diagnosis adhesive capsulitis   ? Referring Provider (PT) Daisy Lazar   ? Onset Date/Surgical Date --   chronic  ? Hand Dominance Right   ? Next MD Visit Wyatt Portela   ? Prior Therapy PT here   ?  ? Precautions  ? Precautions None   ? Precaution Comments epilepsy   ?  ? Restrictions  ? Weight Bearing Restrictions No   ?  ? Balance Screen  ? Has the patient fallen in the past 6 months No   ? Has the patient had a decrease in activity level because of a fear of falling?  No   ? Is the patient reluctant to leave their home because of a  fear of falling?  No   ?  ? Home Environment  ? Living Environment Private residence   ?  ? Prior Function  ? Level of Independence Independent;Independent with basic ADLs   ? Vocation Unemployed   ?  ? Observation/Other Assessments  ? Focus on Therapeutic Outcomes (FOTO)  44   ?  ? AROM  ? Right Shoulder Flexion 132 Degrees   ? Right Shoulder ABduction 95 Degrees   ?  ? PROM  ? Right Shoulder Flexion 135 Degrees   ? Right Shoulder ABduction 112 Degrees   ? Right Shoulder Internal Rotation 80 Degrees   ? Right Shoulder External Rotation 45 Degrees   ?  ? Strength  ? Right Shoulder Flexion 3/5   ? Right Shoulder Extension 3+/5   ? Right Shoulder ABduction 3-/5   ? Right Shoulder Internal Rotation 4-/5   ? Right Shoulder External Rotation 4-/5   ? Right Elbow Flexion 4+/5   ? Right Elbow Extension 3/5   ? ?  ?  ? ?  ? ? ? ? ? ? ? ? ? ? ? ? ? ? ? ?  [place] Adult PT Treatment/Exercise - 06/18/21 0001   ? ?  ? Shoulder Exercises: Standing  ? Other Standing Exercises ladder flexion stretch 10x10 seconds   ? Other Standing Exercises wall pushups 1x10; cw/ccw ball rolls on wall 1x10 each; self lat massage with ball on wall   ? ?  ?  ? ?  ? ? ? ? ? ? ? ? ? ? PT Education - 06/18/21 1701   ? ? Education Details POC moving forward   ? Person(s) Educated Patient   ? Methods Explanation   ? Comprehension Verbalized understanding   ? ?  ?  ? ?  ? ? ? PT Short Term Goals - 06/18/21 1703   ? ?  ? PT SHORT TERM GOAL #1  ? Title Will be independent with appropriate HEP   ? Time 4   ? Period Weeks   ? Status Achieved   ?  ? PT SHORT TERM GOAL #2  ? Title R shoulder flexion and abduction to have improved by 20 degrees   ? Time 4   ? Period Weeks   ? Status Partially Met   ?  ? PT SHORT TERM GOAL #3  ? Title R shoulder ER ROM to have improved by 10 degrees   ? Baseline PROM better   ? Time 4   ? Period Weeks   ? Status On-going   ?  ? PT SHORT TERM GOAL #4  ? Title Will have better awareness of postural and ergonomic mechanics   ? Time 4    ? Period Weeks   ? Status On-going   ? ?  ?  ? ?  ? ? ? ? PT Long Term Goals - 06/18/21 1703   ? ?  ? PT LONG TERM GOAL #1  ? Title MMT to have improved by 1 grade in all weak groups   ? Time 8   ? Period Weeks   ? Status On-going   ? Target Date 06/18/21   ?  ? PT LONG TERM GOAL #2  ? Title Pain to be no more than 4/10 at worst in R shoulder   ? Time 8   ? Period Weeks   ? Status On-going   ?  ? PT LONG TERM GOAL #3  ? Title Will be able to reach overhead with pain no more than 4/10 in R shoulder and no compensation patterns   ? Time 8   ? Period Weeks   ? Status On-going   ?  ? PT LONG TERM GOAL #4  ? Title FOTO score to improve by at least 10 points to show improvement in functional status   ? Time 8   ? Period Weeks   ? Status On-going   ? ?  ?  ? ?  ? ? ? ? ? ? ? ? Plan - 06/18/21 1702   ? ? Clinical Impression Statement Jabar arrives today doing OK- shoulder is still stiff and he still feels quite weak. Objective measures do show improvement in strength and ROM at this point, I do think he will benefit from ongoing skilled PT services to address impairments and continue to work on optimizing R shoulder function. His lats also seem quite tight so we will work to address this as well, may be limiting shoulder elevation by quite a bit too.   ? Personal Factors and Comorbidities Time since onset of injury/illness/exacerbation   ? Examination-Activity Limitations Reach Overhead;Carry;Sleep;Dressing;Hygiene/Grooming   ?  Examination-Participation Restrictions Cleaning;Community Activity;Driving;Interpersonal Relationship;Shop;Laundry;[employer] Work   ? Stability/Clinical Decision Making Stable/Uncomplicated   ? Clinical Decision Making Low   ? Rehab Potential Fair   ? PT Frequency 2x / week   ? PT Duration 6 weeks   ? PT Treatment/Interventions ADLs/Self Care Home Management;Electrical Stimulation;Cryotherapy;Iontophoresis 4mg /ml Dexamethasone;Moist Heat;Traction;Ultrasound;Therapeutic exercise;Therapeutic  activities;Patient/family education;Manual techniques;Passive range of motion;Dry needling;Taping;Vasopneumatic Device   ? PT Next Visit Plan continue focus on ROM and strength   ? PT Home Exercise Plan [id number]   ? Consulted and Agree with Plan of Care Patient   ? ?  ?  ? ?  ? ? ?Patient will benefit from skilled therapeutic intervention in order to improve the following deficits and impairments:  Decreased range of motion, Increased muscle spasms, Pain, Improper body mechanics, Impaired flexibility, Impaired UE functional use, Decreased strength, Postural dysfunction, Increased fascial restricitons, Hypomobility ? ?Visit Diagnosis: ?Stiffness of right shoulder, not elsewhere classified ? ?Acute pain of right shoulder ? ?Muscle weakness (generalized) ? ? ? ? ?Problem List ?Patient Active Problem List  ? Diagnosis Date Noted  ? Hyperlipidemia associated with type 2 diabetes mellitus ([place]) 11/17/2018  ? Chronic bilateral low back pain without sciatica 01/20/2018  ? Chronic left shoulder pain 01/20/2018  ? Fatigue 01/20/2018  ? Neck pain 01/20/2018  ? Uncontrolled type 2 diabetes mellitus with diabetic neuropathy, with long-term current use of insulin 01/20/2018  ? Uncontrolled type 2 diabetes mellitus with hyperglycemia, with long-term current use of insulin ([place]) 10/05/2017  ? Neuropathy 06/09/2016  ? Localization-related idiopathic epilepsy and epileptic syndromes with seizures of localized onset, not intractable, without status epilepticus ([place]) 02/14/2015  ? ?Khalil Szczepanik U PT, DPT, PN2  ? ?Supplemental Physical Therapist ?[place]  ? ? ? ? ? ?[place] ?[place] ?[address] ?[place], [place], [address] ?Phone: [phone]   Fax:  [phone] ? ?Name: Damiano Stamper ?MRN: [id number] ?Date of Birth: 03-25-1975 ? ? ? ?

## 2021-07-04 ENCOUNTER — Ambulatory Visit: Payer: Medicaid Other | Admitting: Physical Therapy

## 2021-07-10 ENCOUNTER — Ambulatory Visit: Payer: Medicaid Other | Attending: Physician Assistant

## 2021-07-10 DIAGNOSIS — M6281 Muscle weakness (generalized): Secondary | ICD-10-CM | POA: Diagnosis present

## 2021-07-10 DIAGNOSIS — M25611 Stiffness of right shoulder, not elsewhere classified: Secondary | ICD-10-CM | POA: Insufficient documentation

## 2021-07-10 DIAGNOSIS — M25511 Pain in right shoulder: Secondary | ICD-10-CM | POA: Insufficient documentation

## 2021-07-10 NOTE — Therapy (Signed)
[place]. [place], [place], [address] Phone: [phone]   Fax:  [phone]  Physical Therapy Treatment  Patient Details  Name: Donald Simmons MRN: [id number] Date of Birth: 1975-12-24 Referring Provider (PT): Daisy Lazar   Encounter Date: 07/10/2021   PT End of Session - 07/10/21 1616     Visit Number 6    PT Start Time 1962    PT Stop Time 1642    PT Time Calculation (min) 27 min    Activity Tolerance Patient limited by pain    Behavior During Therapy Flat affect   Fatigued             Past Medical History:  Diagnosis Date   Diabetes ([place])    Diabetes mellitus without complication ([place])    Frozen shoulder    Hyperlipemia    Hyperlipemia     History reviewed. No pertinent surgical history.  There were no vitals filed for this visit.   Subjective Assessment - 07/10/21 1652     Subjective Patient is complaining of increased pain due to having to move furniture today. He did not want to reschedule because was afraid they would cancel his remaining appts. He is extremely fatigued and says the weather is making it worse. R shoulder is burning/throbbing and a 8/10.    Patient Stated Goals be able to use arm for basic household things, reach overhead    Currently in Pain? Yes    Pain Score 8     Pain Location Shoulder    Pain Orientation Right    Pain Descriptors / Indicators Burning;Sore;Throbbing    Pain Onset More than a month ago             Treatment UBE 2.5 mins forwards unable to continue   PROM flexion, abd, ER  Shoulder AAROM flexion x8 w/dowel                                PT Short Term Goals - 06/18/21 1703       PT SHORT TERM GOAL #1   Title Will be independent with appropriate HEP    Time 4    Period Weeks    Status Achieved      PT SHORT TERM GOAL #2   Title R shoulder flexion and abduction to have improved by 20 degrees    Time 4     Period Weeks    Status Partially Met      PT SHORT TERM GOAL #3   Title R shoulder ER ROM to have improved by 10 degrees    Baseline PROM better    Time 4    Period Weeks    Status On-going      PT SHORT TERM GOAL #4   Title Will have better awareness of postural and ergonomic mechanics    Time 4    Period Weeks    Status On-going               PT Long Term Goals - 06/18/21 1703       PT LONG TERM GOAL #1   Title MMT to have improved by 1 grade in all weak groups    Time 8    Period Weeks    Status On-going    Target Date 06/18/21      PT LONG TERM GOAL #2   Title Pain to be  no more than 4/10 at worst in R shoulder    Time 8    Period Weeks    Status On-going      PT LONG TERM GOAL #3   Title Will be able to reach overhead with pain no more than 4/10 in R shoulder and no compensation patterns    Time 8    Period Weeks    Status On-going      PT LONG TERM GOAL #4   Title FOTO score to improve by at least 10 points to show improvement in functional status    Time 8    Period Weeks    Status On-going                   Plan - 07/10/21 1646     Clinical Impression Statement Patient returns to PT feeling extremely tired and in lots of pain. Patient said he did not want to reschedule appointment but was unable to complete most of session without high pain levels. Began with UBE but stopped after 2 mins due to throbbing and burning pain. Patient stated heat packs help but he has not done it today, we tried moist heat for 14mins. Pt tolerated PROM manual therapy well but presents with increased muscle guarding and pain. He states that his shoulder is getting better overall and he is able to reach up into cabinet slowly. Attempted therex but patient stated "I can't" and asked if he could ask his wife to come pick him up because "today is just a bad day".    Personal Factors and Comorbidities Time since onset of injury/illness/exacerbation     Examination-Activity Limitations Reach Overhead;Carry;Sleep;Dressing;Hygiene/Grooming    Examination-Participation Restrictions Cleaning;Community Activity;Driving;Interpersonal Relationship;Shop;Laundry;Yard Work    Stability/Clinical Decision Making Stable/Uncomplicated    Rehab Potential Fair    PT Frequency 2x / week    PT Duration 6 weeks    PT Treatment/Interventions ADLs/Self Care Home Management;Electrical Stimulation;Cryotherapy;Iontophoresis 4mg /ml Dexamethasone;Moist Heat;Traction;Ultrasound;Therapeutic exercise;Therapeutic activities;Patient/family education;Manual techniques;Passive range of motion;Dry needling;Taping;Vasopneumatic Device    PT Next Visit Plan continue focus on ROM and strength    PT Home Exercise Plan [id number]    Consulted and Agree with Plan of Care Patient              Patient will benefit from skilled therapeutic intervention in order to improve the following deficits and impairments:  Decreased range of motion, Increased muscle spasms, Pain, Improper body mechanics, Impaired flexibility, Impaired UE functional use, Decreased strength, Postural dysfunction, Increased fascial restricitons, Hypomobility  Visit Diagnosis: Acute pain of right shoulder  Stiffness of right shoulder, not elsewhere classified  Muscle weakness (generalized)     Problem List Patient Active Problem List   Diagnosis Date Noted   Hyperlipidemia associated with type 2 diabetes mellitus ([place]) 11/17/2018   Chronic bilateral low back pain without sciatica 01/20/2018   Chronic left shoulder pain 01/20/2018   Fatigue 01/20/2018   Neck pain 01/20/2018   Uncontrolled type 2 diabetes mellitus with diabetic neuropathy, with long-term current use of insulin 01/20/2018   Uncontrolled type 2 diabetes mellitus with hyperglycemia, with long-term current use of insulin ([place]) 10/05/2017   Neuropathy 06/09/2016   Localization-related idiopathic epilepsy and epileptic syndromes with  seizures of localized onset, not intractable, without status epilepticus ([place]) 02/14/2015   Andris Baumann, PT, DPT      [place]. [place], [place], [address] Phone: [phone]   Fax:  [phone]  Name:  Donald Simmons MRN: [id number] Date of Birth: 06-04-1975

## 2021-07-16 ENCOUNTER — Ambulatory Visit: Payer: Medicaid Other

## 2021-07-16 DIAGNOSIS — M25511 Pain in right shoulder: Secondary | ICD-10-CM

## 2021-07-16 DIAGNOSIS — M25611 Stiffness of right shoulder, not elsewhere classified: Secondary | ICD-10-CM

## 2021-07-16 DIAGNOSIS — M6281 Muscle weakness (generalized): Secondary | ICD-10-CM

## 2021-07-16 NOTE — Therapy (Signed)
[place]. [place], [place], [address] Phone: [phone]   Fax:  [phone]  Physical Therapy Treatment  Patient Details  Name: Donald Simmons MRN: [id number] Date of Birth: 14-Oct-1975 Referring Provider (PT): Daisy Lazar   Encounter Date: 07/16/2021   PT End of Session - 07/16/21 1632     Visit Number 7    PT Start Time 1632    PT Stop Time 1710    PT Time Calculation (min) 38 min    Activity Tolerance Patient limited by pain    Behavior During Therapy [facility] for tasks assessed/performed   Fatigued              Past Medical History:  Diagnosis Date   Diabetes ([place])    Diabetes mellitus without complication ([place])    Frozen shoulder    Hyperlipemia    Hyperlipemia     History reviewed. No pertinent surgical history.  There were no vitals filed for this visit.   Subjective Assessment - 07/16/21 1634     Subjective "shoulder is feeling average, and I still feel some annoying clicking"    Patient Stated Goals be able to use arm for basic household things, reach overhead    Pain Score 7     Pain Location Shoulder    Pain Orientation Right    Pain Descriptors / Indicators Nagging;Dull    Pain Onset More than a month ago              Treatment UBE L2, 3 mins forwards and back  PROM flexion, abd, ER 30sec x3   Shoulder AAROM flexion 2x10 3# cane  Diagonals up and down with red TB 2x10   Rows, ext on cables 10# 2x10  Attempted ER w/5#, then blackTB, then blueTB pt complains of soreness                                  PT Short Term Goals - 06/18/21 1703       PT SHORT TERM GOAL #1   Title Will be independent with appropriate HEP    Time 4    Period Weeks    Status Achieved      PT SHORT TERM GOAL #2   Title R shoulder flexion and abduction to have improved by 20 degrees    Time 4    Period Weeks    Status Partially Met      PT SHORT TERM  GOAL #3   Title R shoulder ER ROM to have improved by 10 degrees    Baseline PROM better    Time 4    Period Weeks    Status On-going      PT SHORT TERM GOAL #4   Title Will have better awareness of postural and ergonomic mechanics    Time 4    Period Weeks    Status On-going               PT Long Term Goals - 06/18/21 1703       PT LONG TERM GOAL #1   Title MMT to have improved by 1 grade in all weak groups    Time 8    Period Weeks    Status On-going    Target Date 06/18/21      PT LONG TERM GOAL #2   Title Pain to be no more than 4/10 at  worst in R shoulder    Time 8    Period Weeks    Status On-going      PT LONG TERM GOAL #3   Title Will be able to reach overhead with pain no more than 4/10 in R shoulder and no compensation patterns    Time 8    Period Weeks    Status On-going      PT LONG TERM GOAL #4   Title FOTO score to improve by at least 10 points to show improvement in functional status    Time 8    Period Weeks    Status On-going                   Plan - 07/16/21 1656     Clinical Impression Statement Patient continues to present with high pain levels and takes additional breaks in between sets. He still feels weak but is showing good improvement with PROM into flexion and abduction and has limitations in ER still. Attempted to do ER exercise but patient has difficulty and says his shoulder is too painful to complete. Patient able to tolerate session well but complains of soreness at the end.    Personal Factors and Comorbidities Time since onset of injury/illness/exacerbation    Examination-Activity Limitations Reach Overhead;Carry;Sleep;Dressing;Hygiene/Grooming    Examination-Participation Restrictions Cleaning;Community Activity;Driving;Interpersonal Relationship;Shop;Laundry;Yard Work    Stability/Clinical Decision Making Stable/Uncomplicated    Rehab Potential Fair    PT Frequency 2x / week    PT Duration 6 weeks    PT  Treatment/Interventions ADLs/Self Care Home Management;Electrical Stimulation;Cryotherapy;Iontophoresis 4mg /ml Dexamethasone;Moist Heat;Traction;Ultrasound;Therapeutic exercise;Therapeutic activities;Patient/family education;Manual techniques;Passive range of motion;Dry needling;Taping;Vasopneumatic Device    PT Next Visit Plan continue focus on ROM and strength    PT Home Exercise Plan [id number]    Consulted and Agree with Plan of Care Patient               Patient will benefit from skilled therapeutic intervention in order to improve the following deficits and impairments:  Decreased range of motion, Increased muscle spasms, Pain, Improper body mechanics, Impaired flexibility, Impaired UE functional use, Decreased strength, Postural dysfunction, Increased fascial restricitons, Hypomobility  Visit Diagnosis: Acute pain of right shoulder  Stiffness of right shoulder, not elsewhere classified  Muscle weakness (generalized)     Problem List Patient Active Problem List   Diagnosis Date Noted   Hyperlipidemia associated with type 2 diabetes mellitus ([place]) 11/17/2018   Chronic bilateral low back pain without sciatica 01/20/2018   Chronic left shoulder pain 01/20/2018   Fatigue 01/20/2018   Neck pain 01/20/2018   Uncontrolled type 2 diabetes mellitus with diabetic neuropathy, with long-term current use of insulin 01/20/2018   Uncontrolled type 2 diabetes mellitus with hyperglycemia, with long-term current use of insulin ([place]) 10/05/2017   Neuropathy 06/09/2016   Localization-related idiopathic epilepsy and epileptic syndromes with seizures of localized onset, not intractable, without status epilepticus ([place]) 02/14/2015   Andris Baumann, PT, DPT      [place]. [place], [place], [address] Phone: [phone]   Fax:  [phone]  Name: Donald Simmons MRN: [id number] Date of Birth: 09-26-1975

## 2021-07-18 ENCOUNTER — Ambulatory Visit: Payer: Medicaid Other

## 2021-07-18 DIAGNOSIS — M25511 Pain in right shoulder: Secondary | ICD-10-CM | POA: Diagnosis not present

## 2021-07-18 DIAGNOSIS — M6281 Muscle weakness (generalized): Secondary | ICD-10-CM

## 2021-07-18 DIAGNOSIS — M25611 Stiffness of right shoulder, not elsewhere classified: Secondary | ICD-10-CM

## 2021-07-18 NOTE — Therapy (Signed)
[place]. [place], [place], [address] Phone: [phone]   Fax:  [phone]  Physical Therapy Treatment  Patient Details  Name: Donald Simmons MRN: [id number] Date of Birth: 1975/10/26 Referring Provider (PT): Daisy Lazar   Encounter Date: 07/18/2021   PT End of Session - 07/18/21 1632     Visit Number 8    PT Start Time 0712    PT Stop Time 1711    PT Time Calculation (min) 40 min    Activity Tolerance Patient limited by pain    Behavior During Therapy [facility] for tasks assessed/performed   Fatigued               Past Medical History:  Diagnosis Date   Diabetes ([place])    Diabetes mellitus without complication ([place])    Frozen shoulder    Hyperlipemia    Hyperlipemia     History reviewed. No pertinent surgical history.  There were no vitals filed for this visit.   Subjective Assessment - 07/18/21 1634     Subjective Patient says that it is raining so his R shoulder feels "stingy" he laid on his L shoulder too long today so it feels sore.    Patient Stated Goals be able to use arm for basic household things, reach overhead    Currently in Pain? Yes    Pain Score 7     Pain Location Shoulder    Pain Orientation Right    Pain Onset More than a month ago               Treatment UBE L2, 3 mins forwards and back  PROM flexion, abd, ER 30sec x3   Pec stretch 15s  Shoulder AAROM flexion 2x10 2# cane  Bicep curls 5# 2x10  Triceps extensions 10# 2x10  Horizontal abd 2x10 w/red band  Rows on cables 15# 2x10  Lat pulldowns 25# 2x10                                  PT Short Term Goals - 06/18/21 1703       PT SHORT TERM GOAL #1   Title Will be independent with appropriate HEP    Time 4    Period Weeks    Status Achieved      PT SHORT TERM GOAL #2   Title R shoulder flexion and abduction to have improved by 20 degrees    Time 4    Period  Weeks    Status Partially Met      PT SHORT TERM GOAL #3   Title R shoulder ER ROM to have improved by 10 degrees    Baseline PROM better    Time 4    Period Weeks    Status On-going      PT SHORT TERM GOAL #4   Title Will have better awareness of postural and ergonomic mechanics    Time 4    Period Weeks    Status On-going               PT Long Term Goals - 06/18/21 1703       PT LONG TERM GOAL #1   Title MMT to have improved by 1 grade in all weak groups    Time 8    Period Weeks    Status On-going    Target Date 06/18/21  PT LONG TERM GOAL #2   Title Pain to be no more than 4/10 at worst in R shoulder    Time 8    Period Weeks    Status On-going      PT LONG TERM GOAL #3   Title Will be able to reach overhead with pain no more than 4/10 in R shoulder and no compensation patterns    Time 8    Period Weeks    Status On-going      PT LONG TERM GOAL #4   Title FOTO score to improve by at least 10 points to show improvement in functional status    Time 8    Period Weeks    Status On-going                   Plan - 07/18/21 1711     Clinical Impression Statement Patient able to complete session but requires additional time and seems lethargic, he attributes his pain to the weather saying when it gets chilly or rains outside he has to put heat on his shoulder to make it feel better. He is able to tolerate PROM better today and into further ranges. Completed power tower exercises with more ease today and states lat pull downs felt easier compared to other exercises.    Personal Factors and Comorbidities Time since onset of injury/illness/exacerbation    Examination-Activity Limitations Reach Overhead;Carry;Sleep;Dressing;Hygiene/Grooming    Examination-Participation Restrictions Cleaning;Community Activity;Driving;Interpersonal Relationship;Shop;Laundry;Yard Work    Stability/Clinical Decision Making Stable/Uncomplicated    Rehab Potential Fair     PT Frequency 2x / week    PT Duration 6 weeks    PT Treatment/Interventions ADLs/Self Care Home Management;Electrical Stimulation;Cryotherapy;Iontophoresis 4mg /ml Dexamethasone;Moist Heat;Traction;Ultrasound;Therapeutic exercise;Therapeutic activities;Patient/family education;Manual techniques;Passive range of motion;Dry needling;Taping;Vasopneumatic Device    PT Next Visit Plan continue focus on ROM and strength    PT Home Exercise Plan [id number]    Consulted and Agree with Plan of Care Patient                Patient will benefit from skilled therapeutic intervention in order to improve the following deficits and impairments:  Decreased range of motion, Increased muscle spasms, Pain, Improper body mechanics, Impaired flexibility, Impaired UE functional use, Decreased strength, Postural dysfunction, Increased fascial restricitons, Hypomobility  Visit Diagnosis: Acute pain of right shoulder  Stiffness of right shoulder, not elsewhere classified  Muscle weakness (generalized)     Problem List Patient Active Problem List   Diagnosis Date Noted   Hyperlipidemia associated with type 2 diabetes mellitus ([place]) 11/17/2018   Chronic bilateral low back pain without sciatica 01/20/2018   Chronic left shoulder pain 01/20/2018   Fatigue 01/20/2018   Neck pain 01/20/2018   Uncontrolled type 2 diabetes mellitus with diabetic neuropathy, with long-term current use of insulin 01/20/2018   Uncontrolled type 2 diabetes mellitus with hyperglycemia, with long-term current use of insulin ([place]) 10/05/2017   Neuropathy 06/09/2016   Localization-related idiopathic epilepsy and epileptic syndromes with seizures of localized onset, not intractable, without status epilepticus ([place]) 02/14/2015   Andris Baumann, PT, DPT      [place]. [place], [place], [address] Phone: [phone]   Fax:  [phone]  Name: Donald Simmons MRN:  [id number] Date of Birth: Oct 31, 1975

## 2021-07-24 ENCOUNTER — Ambulatory Visit: Payer: Medicaid Other

## 2021-07-25 ENCOUNTER — Ambulatory Visit: Payer: Medicaid Other

## 2021-07-29 ENCOUNTER — Ambulatory Visit: Payer: Medicaid Other | Admitting: Physical Therapy

## 2021-07-29 ENCOUNTER — Encounter: Payer: Self-pay | Admitting: Physical Therapy

## 2021-07-29 DIAGNOSIS — M25511 Pain in right shoulder: Secondary | ICD-10-CM | POA: Diagnosis not present

## 2021-07-29 DIAGNOSIS — M6281 Muscle weakness (generalized): Secondary | ICD-10-CM

## 2021-07-29 DIAGNOSIS — M25611 Stiffness of right shoulder, not elsewhere classified: Secondary | ICD-10-CM

## 2021-07-29 NOTE — Therapy (Signed)
[facility] [address]. [place], [place], [address] Phone: [phone]   Fax:  [phone]  Physical Therapy Treatment  Patient Details  Name: Donald Simmons MRN: [id number] Date of Birth: 16-Jul-1975 Referring Provider (PT): Misty Stanley   Encounter Date: 07/29/2021   PT End of Session - 07/29/21 1641     Visit Number 9    Date for PT Re-Evaluation 08/13/21    PT Start Time 1602    PT Stop Time 1645    PT Time Calculation (min) 43 min    Activity Tolerance Patient tolerated treatment well    Behavior During Therapy [facility] for tasks assessed/performed             Past Medical History:  Diagnosis Date   Diabetes ([facility])    Diabetes mellitus without complication ([facility])    Frozen shoulder    Hyperlipemia    Hyperlipemia     History reviewed. No pertinent surgical history.  There were no vitals filed for this visit.   Subjective Assessment - 07/29/21 1607     Subjective Ok mentally not sure physically    Currently in Pain? No/denies                [facility] PT Assessment - 07/29/21 0001       AROM   Right Shoulder Flexion 132 Degrees    Right Shoulder ABduction 99 Degrees                           [facility] Adult PT Treatment/Exercise - 07/29/21 0001       Shoulder Exercises: Standing   External Rotation 20 reps;Theraband;Strengthening    Theraband Level (Shoulder External Rotation) Level 2 (Red)    Extension Strengthening;Both;20 reps;Weights    Extension Weight (lbs) 10    Other Standing Exercises shoulder flexion/abduction 3# 2x10 B      Shoulder Exercises: ROM/Strengthening   UBE (Upper Arm Bike) L2.5 x3 min each    Lat Pull 20 reps   25#   Cybex Row 20 reps   25#     Manual Therapy   Manual Therapy Passive ROM    Passive ROM R shoulde in all dirextions with end range holds                       PT Short Term Goals - 07/29/21 1646       PT SHORT TERM GOAL #1    Title Will be independent with appropriate HEP    Status Achieved      PT SHORT TERM GOAL #2   Title R shoulder flexion and abduction to have improved by 20 degrees    Status Partially Met      PT SHORT TERM GOAL #4   Title Will have better awareness of postural and ergonomic mechanics    Status Achieved               PT Long Term Goals - 07/29/21 1646       PT LONG TERM GOAL #2   Title Pain to be no more than 4/10 at worst in R shoulder    Status On-going      PT LONG TERM GOAL #3   Title Will be able to reach overhead with pain no more than 4/10 in R shoulder and no compensation patterns    Status On-going  Plan - 07/29/21 1641     Clinical Impression Statement Pt enters clinic feeling  ok. No improvement made with R shoulder AROM. He reports a bad week last week due to the rain. in regards to pain. All interventions completed well with some reports of fatigue. Pt had the most difficulty wit standing shoulder flexion and abduction. Cue needed to relax during MT. Some end range pain reported with PROM.    Personal Factors and Comorbidities Time since onset of injury/illness/exacerbation    Examination-Activity Limitations Reach Overhead;Carry;Sleep;Dressing;Hygiene/Grooming    Examination-Participation Restrictions Cleaning;Community Activity;Driving;Interpersonal Relationship;Shop;Laundry;Yard Work    [employer] Potential Fair    PT Frequency 2x / week    PT Duration 6 weeks    PT Treatment/Interventions ADLs/Self Care Home Management;Electrical Stimulation;Cryotherapy;Iontophoresis 4mg /ml Dexamethasone;Moist Heat;Traction;Ultrasound;Therapeutic exercise;Therapeutic activities;Patient/family education;Manual techniques;Passive range of motion;Dry needling;Taping;Vasopneumatic Device    PT Next Visit Plan continue focus on ROM and strength             Patient will benefit from skilled therapeutic intervention in order to improve the following  deficits and impairments:  Decreased range of motion, Increased muscle spasms, Pain, Improper body mechanics, Impaired flexibility, Impaired UE functional use, Decreased strength, Postural dysfunction, Increased fascial restricitons, Hypomobility  Visit Diagnosis: Acute pain of right shoulder  Stiffness of right shoulder, not elsewhere classified  Muscle weakness (generalized)     Problem List Patient Active Problem List   Diagnosis Date Noted   Hyperlipidemia associated with type 2 diabetes mellitus ([facility]) 11/17/2018   Chronic bilateral low back pain without sciatica 01/20/2018   Chronic left shoulder pain 01/20/2018   Fatigue 01/20/2018   Neck pain 01/20/2018   Uncontrolled type 2 diabetes mellitus with diabetic neuropathy, with long-term current use of insulin 01/20/2018   Uncontrolled type 2 diabetes mellitus with hyperglycemia, with long-term current use of insulin ([facility]) 10/05/2017   Neuropathy 06/09/2016   Localization-related idiopathic epilepsy and epileptic syndromes with seizures of localized onset, not intractable, without status epilepticus ([facility]) 02/14/2015    Grayce Sessions, PTA 07/29/2021, 4:46 PM  [facility] [address]. [place], [place], [address] Phone: [phone]   Fax:  [phone]  Name: Donald Simmons MRN: [id number] Date of Birth: 1975-11-28

## 2021-08-01 ENCOUNTER — Ambulatory Visit: Payer: Medicaid Other | Admitting: Physical Therapy

## 2021-08-05 ENCOUNTER — Ambulatory Visit: Payer: Medicaid Other | Attending: Physician Assistant

## 2021-08-05 DIAGNOSIS — M6281 Muscle weakness (generalized): Secondary | ICD-10-CM | POA: Insufficient documentation

## 2021-08-05 DIAGNOSIS — M25511 Pain in right shoulder: Secondary | ICD-10-CM | POA: Diagnosis not present

## 2021-08-05 DIAGNOSIS — M25611 Stiffness of right shoulder, not elsewhere classified: Secondary | ICD-10-CM | POA: Diagnosis present

## 2021-08-05 NOTE — Therapy (Signed)
[place] [place], [place], [address] Phone: [phone]   Fax:  [phone]  Physical Therapy Treatment   Progress Note Reporting Period 04/23/21 to 08/05/21  See note below for Objective Data and Assessment of Progress/Goals.      Patient Details  Name: Donald Simmons MRN: [id number] Date of Birth: Jul 21, 1975 Referring Provider (PT): Daisy Lazar   Encounter Date: 08/05/2021   PT End of Session - 08/05/21 0931     Visit Number 10    Date for PT Re-Evaluation 08/13/21    PT Start Time 0931    PT Stop Time 1003    PT Time Calculation (min) 32 min    Activity Tolerance Patient tolerated treatment well    Behavior During Therapy [facility] for tasks assessed/performed             Past Medical History:  Diagnosis Date   Diabetes ([place])    Diabetes mellitus without complication ([place])    Frozen shoulder    Hyperlipemia    Hyperlipemia     History reviewed. No pertinent surgical history.  There were no vitals filed for this visit.   Subjective Assessment - 08/05/21 0933     Subjective Patient states he is not doing well and got banged up this weekend from a kids birthday party. My back and legs are hurting from moving stuff and I almost went to the hospital because it's that bad. The shoulder is not good either but it hurts the least out of everything today.    Patient Stated Goals be able to use arm for basic household things, reach overhead    Currently in Pain? Yes    Pain Score 8     Pain Location Back    Pain Onset In the past 7 days    Multiple Pain Sites Yes    Pain Score 8    Pain Location Shoulder    Pain Orientation Right    Pain Onset More than a month ago             Treatment UBE L2 x6 Measurements for progress note PROM flex/ABD/ER/IR     AROM Flexion 131  ABD 105 IR 65 ER 42   MMT (pain with all of them)  Flexion 3-  Ext 3+  ABD (3) IR 4-  ER 3+  Elbow  4/5                   PT Short Term Goals - 08/05/21 1001       PT SHORT TERM GOAL #1   Title Will be independent with appropriate HEP    Time 4    Period Weeks    Status Achieved      PT SHORT TERM GOAL #2   Title R shoulder flexion and abduction to have improved by 20 degrees    Time 4    Period Weeks    Status Partially Met      PT SHORT TERM GOAL #3   Title R shoulder ER ROM to have improved by 10 degrees    Time 4    Period Weeks    Status Achieved      PT SHORT TERM GOAL #4   Title Will have better awareness of postural and ergonomic mechanics    Time 4    Period Weeks    Status On-going               PT  Long Term Goals - 08/05/21 1002       PT LONG TERM GOAL #1   Title MMT to have improved by 1 grade in all weak groups    Time 8    Period Weeks    Status On-going    Target Date 08/13/21      PT LONG TERM GOAL #2   Title Pain to be no more than 4/10 at worst in R shoulder    Time 8    Period Weeks    Status On-going      PT LONG TERM GOAL #3   Title Will be able to reach overhead with pain no more than 4/10 in R shoulder and no compensation patterns    Time 8    Period Weeks    Status On-going      PT LONG TERM GOAL #4   Title FOTO score to improve by at least 10 points to show improvement in functional status    Time 8    Period Weeks    Status Achieved                   Plan - 08/05/21 1006     Clinical Impression Statement Patient was not doing well today, had difficulty with UBE warm up due to pain and feeling tired. He was moving really slow and unable to complete exercises today. He also states that he been having more seizures than normal and says it could be from high stress.  He wants to go to this doctor to see what is going on with his back and seizures and it could be that "he just did too much." We took measurements for progress note and ended the visit with PROM. He still has deficits in strength and ROM  and is very limited by pain. FOTO score did improve from a 44 to 59.    Personal Factors and Comorbidities Time since onset of injury/illness/exacerbation    Examination-Activity Limitations Reach Overhead;Carry;Sleep;Dressing;Hygiene/Grooming    Examination-Participation Restrictions Cleaning;Community Activity;Driving;Interpersonal Relationship;Shop;Laundry;Yard Work    [employer]    PT Frequency 2x / week    PT Duration 6 weeks    PT Treatment/Interventions ADLs/Self Care Home Management;Electrical Stimulation;Cryotherapy;Iontophoresis 60m/ml Dexamethasone;Moist Heat;Traction;Ultrasound;Therapeutic exercise;Therapeutic activities;Patient/family education;Manual techniques;Passive range of motion;Dry needling;Taping;Vasopneumatic Device    PT Next Visit Plan continue focus on ROM and strength             Patient will benefit from skilled therapeutic intervention in order to improve the following deficits and impairments:  Decreased range of motion, Increased muscle spasms, Pain, Improper body mechanics, Impaired flexibility, Impaired UE functional use, Decreased strength, Postural dysfunction, Increased fascial restricitons, Hypomobility  Visit Diagnosis: Acute pain of right shoulder  Stiffness of right shoulder, not elsewhere classified  Muscle weakness (generalized)     Problem List Patient Active Problem List   Diagnosis Date Noted   Hyperlipidemia associated with type 2 diabetes mellitus ([place] 11/17/2018   Chronic bilateral low back pain without sciatica 01/20/2018   Chronic left shoulder pain 01/20/2018   Fatigue 01/20/2018   Neck pain 01/20/2018   Uncontrolled type 2 diabetes mellitus with diabetic neuropathy, with long-term current use of insulin 01/20/2018   Uncontrolled type 2 diabetes mellitus with hyperglycemia, with long-term current use of insulin ([facility] 10/05/2017   Neuropathy 06/09/2016   Localization-related idiopathic epilepsy and epileptic syndromes  with seizures of localized onset, not intractable, without status epilepticus ([facility] 02/14/2015    MAndris Baumann PT  08/05/2021, 10:12 AM  [place]. [place], [place], [address] Phone: [phone]   Fax:  [phone]  Name: Gwendolyn Nishi MRN: [id number] Date of Birth: Mar 01, 1975

## 2021-08-07 ENCOUNTER — Ambulatory Visit: Payer: Medicaid Other

## 2021-08-27 ENCOUNTER — Other Ambulatory Visit: Payer: Self-pay | Admitting: Neurology

## 2021-08-27 DIAGNOSIS — G40019 Localization-related (focal) (partial) idiopathic epilepsy and epileptic syndromes with seizures of localized onset, intractable, without status epilepticus: Secondary | ICD-10-CM

## 2021-09-03 ENCOUNTER — Ambulatory Visit: Payer: Medicaid Other

## 2021-09-05 ENCOUNTER — Ambulatory Visit: Payer: Medicaid Other

## 2021-09-16 ENCOUNTER — Ambulatory Visit: Payer: Medicaid Other

## 2021-09-18 ENCOUNTER — Ambulatory Visit: Payer: Medicaid Other

## 2021-09-24 ENCOUNTER — Ambulatory Visit: Payer: Medicaid Other

## 2021-09-26 ENCOUNTER — Ambulatory Visit: Payer: Medicaid Other | Attending: Physician Assistant | Admitting: Physical Therapy

## 2021-12-06 IMAGING — DX DG CHEST 1V PORT
1 series · 1 of 1 positions shown · non-contrast
Comparison: 03/30/2015

CLINICAL DATA: Chest pain for 1 week.

EXAM:
PORTABLE CHEST 1 VIEW

[chest ap]
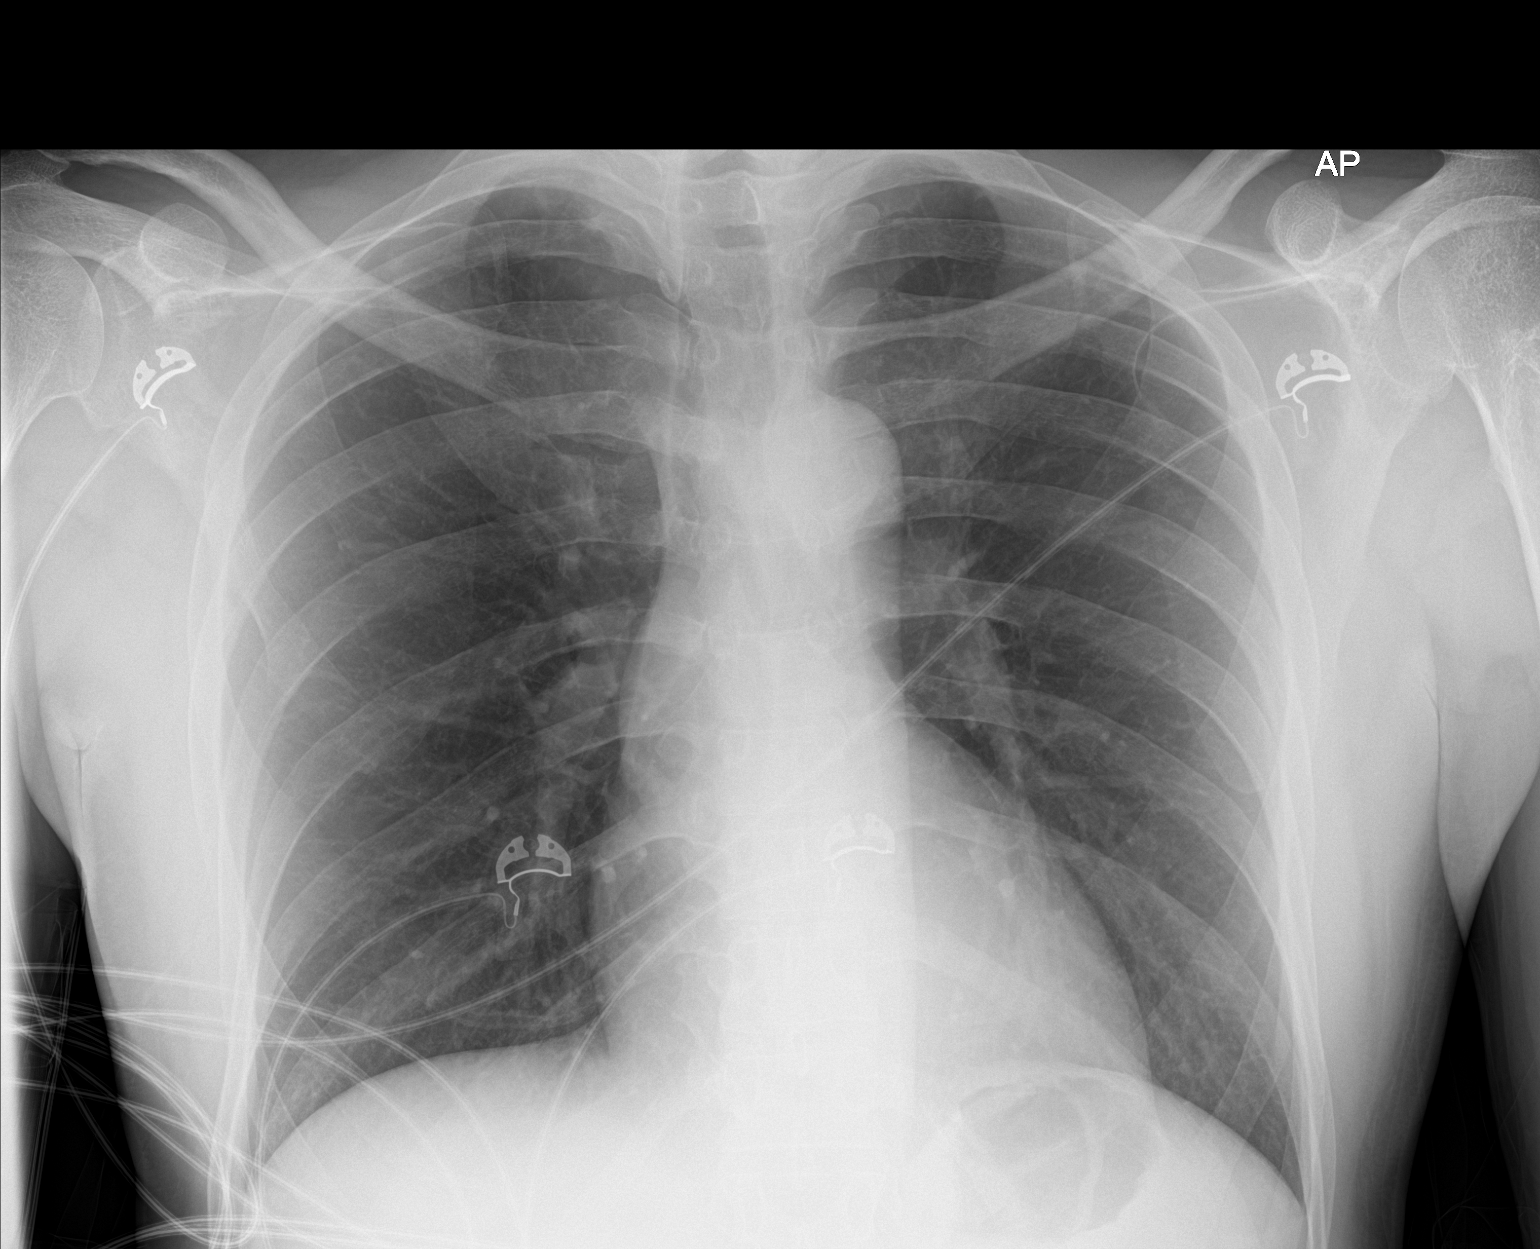

[1 of 1 positions shown; findings below may reference images not displayed]

FINDINGS: The cardiomediastinal silhouette is within normal limits for
portable AP technique. No airspace consolidation, edema, pleural
effusion, pneumothorax is identified. No acute osseous abnormality
is identified.
IMPRESSION: No active disease.

## 2022-06-05 ENCOUNTER — Emergency Department (HOSPITAL_COMMUNITY)
Admission: EM | Admit: 2022-06-05 | Discharge: 2022-06-05 | Disposition: A | Discharge status: Home / Self Care | Payer: Medicaid Other | Attending: Emergency Medicine | Admitting: Emergency Medicine

## 2022-06-05 ENCOUNTER — Other Ambulatory Visit: Payer: Self-pay

## 2022-06-05 ENCOUNTER — Emergency Department (HOSPITAL_COMMUNITY): Payer: Medicaid Other

## 2022-06-05 ENCOUNTER — Encounter (HOSPITAL_COMMUNITY): Payer: Self-pay

## 2022-06-05 DIAGNOSIS — E119 Type 2 diabetes mellitus without complications: Secondary | ICD-10-CM | POA: Diagnosis not present

## 2022-06-05 DIAGNOSIS — R531 Weakness: Secondary | ICD-10-CM | POA: Diagnosis present

## 2022-06-05 DIAGNOSIS — R200 Anesthesia of skin: Secondary | ICD-10-CM | POA: Insufficient documentation

## 2022-06-05 LAB — CBC WITH DIFFERENTIAL/PLATELET
Abs Immature Granulocytes: 0.01 K/uL (ref 0.00–0.07)
Basophils Absolute: 0 K/uL (ref 0.0–0.1)
Basophils Relative: 0 %
Eosinophils Absolute: 0.1 K/uL (ref 0.0–0.5)
Eosinophils Relative: 2 %
HCT: 41.7 % (ref 39.0–52.0)
Hemoglobin: 14.3 g/dL (ref 13.0–17.0)
Immature Granulocytes: 0 %
Lymphocytes Relative: 43 %
Lymphs Abs: 2 K/uL (ref 0.7–4.0)
MCH: 29.4 pg (ref 26.0–34.0)
MCHC: 34.3 g/dL (ref 30.0–36.0)
MCV: 85.6 fL (ref 80.0–100.0)
Monocytes Absolute: 0.4 K/uL (ref 0.1–1.0)
Monocytes Relative: 8 %
Neutro Abs: 2.2 K/uL (ref 1.7–7.7)
Neutrophils Relative %: 47 %
Platelets: 227 K/uL (ref 150–400)
RBC: 4.87 MIL/uL (ref 4.22–5.81)
RDW: 13.7 % (ref 11.5–15.5)
WBC: 4.8 K/uL (ref 4.0–10.5)
nRBC: 0 % (ref 0.0–0.2)

## 2022-06-05 LAB — COMPREHENSIVE METABOLIC PANEL WITH GFR
ALT: 18 U/L (ref 0–44)
AST: 26 U/L (ref 15–41)
Albumin: 4 g/dL (ref 3.5–5.0)
Alkaline Phosphatase: 55 U/L (ref 38–126)
Anion gap: 6 (ref 5–15)
BUN: 6 mg/dL (ref 6–20)
CO2: 25 mmol/L (ref 22–32)
Calcium: 9.2 mg/dL (ref 8.9–10.3)
Chloride: 102 mmol/L (ref 98–111)
Creatinine, Ser: 0.82 mg/dL (ref 0.61–1.24)
GFR, Estimated: >60 mL/min
Glucose, Bld: 187 mg/dL — ABNORMAL HIGH (ref 70–99)
Potassium: 4.2 mmol/L (ref 3.5–5.1)
Sodium: 133 mmol/L — ABNORMAL LOW (ref 135–145)
Total Bilirubin: 1 mg/dL (ref 0.3–1.2)
Total Protein: 7.5 g/dL (ref 6.5–8.1)

## 2022-06-05 LAB — RAPID URINE DRUG SCREEN, HOSP PERFORMED
Amphetamines: NOT DETECTED
Barbiturates: NOT DETECTED
Benzodiazepines: NOT DETECTED
Cocaine: NOT DETECTED
Opiates: NOT DETECTED
Tetrahydrocannabinol: NOT DETECTED

## 2022-06-05 NOTE — ED Provider Notes (Signed)
[place] EMERGENCY DEPARTMENT AT [facility] Provider Note   CSN: [id number] Arrival date & time: 06/05/22  1304     History  Chief Complaint  Patient presents with   Extremity Weakness    Donald Simmons is a 47 y.o. male.  Pt is a 47 yo male with pmhx significant for seizures, dm, and hld.  Pt said he felt some numbness to his left face, arm and leg last night.  He has had some issues with neuropathy affecting the left side, but this was a little different.  Pt said sx lasted about 1/2 hr.  Pt called his pcp this am who told him to come to the ED for further eval.         Home Medications Prior to Admission medications   Medication Sig Start Date End Date Taking? Authorizing Provider  docusate sodium (COLACE) 250 MG capsule Take 1 capsule (250 mg total) by mouth daily. 12/23/19   Linwood Dibbles, MD  Eslicarbazepine Acetate (APTIOM) 400 MG TABS Take 3 tablets every night 05/19/20   Van Clines, MD  glucose blood (ACCU-CHEK AVIVA) test strip Use as instructed to check blood sugar three times daily. 08/12/17   Reather Littler, MD  lamoTRIgine (LAMICTAL) 100 MG tablet Take 2 tablets twice a day 05/19/20   Van Clines, MD  Lancets 28G MISC 1 each by Does not apply route daily. Use to check blood sugar three times daily. 08/12/17   Reather Littler, MD  pantoprazole (PROTONIX) 20 MG tablet Take 1 tablet (20 mg total) by mouth daily for 14 days. 06/01/20 06/15/20  Curatolo, Adam, DO  sucralfate (CARAFATE) 1 g tablet Take 1 tablet (1 g total) by mouth 4 (four) times daily -  with meals and at bedtime for 14 days. 06/01/20 06/15/20  Virgina Norfolk, DO      Allergies    Asa [aspirin] and Asa [aspirin]    Review of Systems   Review of Systems  Neurological:  Positive for weakness and numbness.  All other systems reviewed and are negative.   Physical Exam Updated Vital Signs BP (!) 128/92   Pulse 77   Temp 98 F (36.7 C) (Oral)   Resp 17   Ht 6\' 2"  (1.88 m)   Wt 81.6 kg    SpO2 100%   BMI 23.11 kg/m  Physical Exam Vitals and nursing note reviewed.  Constitutional:      Appearance: Normal appearance.  HENT:     Head: Normocephalic and atraumatic.     Right Ear: External ear normal.     Left Ear: External ear normal.     Nose: Nose normal.     Mouth/Throat:     Mouth: Mucous membranes are moist.     Pharynx: Oropharynx is clear.  Eyes:     Conjunctiva/sclera: Conjunctivae normal.     Pupils: Pupils are equal, round, and reactive to light.     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     Pulses: Normal pulses.     Heart sounds: Normal heart sounds.  Pulmonary:     Effort: Pulmonary effort is normal.     Breath sounds: Normal breath sounds.  Abdominal:     General: Abdomen is flat. Bowel sounds are normal.     Palpations: Abdomen is soft.  Musculoskeletal:        General: Normal range of motion.  Cervical back: Normal range of motion and neck supple.  Skin:    General: Skin is warm.     Capillary Refill: Capillary refill takes less than 2 seconds.  Neurological:     General: No focal deficit present.     Mental Status: He is alert and oriented to person, place, and time.  Psychiatric:        Mood and Affect: Mood normal.        Behavior: Behavior normal.     ED Results / Procedures / Treatments   Labs (all labs ordered are listed, but only abnormal results are displayed) Labs Reviewed  COMPREHENSIVE METABOLIC PANEL - Abnormal; Notable for the following components:      Result Value   Sodium 133 (*)    Glucose, Bld 187 (*)    All other components within normal limits  CBC WITH DIFFERENTIAL/PLATELET  RAPID URINE DRUG SCREEN, HOSP PERFORMED    EKG None  Radiology MR BRAIN WO CONTRAST  Result Date: 06/05/2022 CLINICAL DATA:  Acute neurologic deficit EXAM: MRI HEAD WITHOUT CONTRAST TECHNIQUE: Multiplanar, multiecho pulse sequences of the brain and surrounding  structures were obtained without intravenous contrast. COMPARISON:  None Available. FINDINGS: Brain: No acute infarct, mass effect or extra-axial collection. No acute or chronic hemorrhage. Normal white matter signal, parenchymal volume and CSF spaces. The midline structures are normal. Vascular: Major flow voids are preserved. Skull and upper cervical spine: Normal calvarium and skull base. Visualized upper cervical spine and soft tissues are normal. Sinuses/Orbits:No paranasal sinus fluid levels or advanced mucosal thickening. No mastoid or middle ear effusion. Normal orbits. IMPRESSION: Normal brain MRI. Electronically Signed   By: Deatra Robinson M.D.   On: 06/05/2022 21:14   CT Head Wo Contrast  Result Date: 06/05/2022 CLINICAL DATA:  Neuro deficit, acute, stroke suspected EXAM: CT HEAD WITHOUT CONTRAST TECHNIQUE: Contiguous axial images were obtained from the base of the skull through the vertex without intravenous contrast. RADIATION DOSE REDUCTION: This exam was performed according to the departmental dose-optimization program which includes automated exposure control, adjustment of the mA and/or kV according to patient size and/or use of iterative reconstruction technique. COMPARISON:  None Available. FINDINGS: Brain: No evidence of acute infarction, hemorrhage, hydrocephalus, extra-axial collection or mass lesion/mass effect. Vascular: No hyperdense vessel or unexpected calcification. Skull: No acute fracture. Sinuses/Orbits: Clear sinuses.  No acute findings. Other: No mastoid effusions. IMPRESSION: No evidence of acute intracranial abnormality. Electronically Signed   By: Feliberto Harts M.D.   On: 06/05/2022 15:07    Procedures Procedures    Medications Ordered in ED Medications - No data to display  ED Course/ Medical Decision Making/ A&P                             Medical Decision Making Amount and/or Complexity of Data Reviewed Radiology: ordered.   This patient presents to the  ED for concern of cva, this involves an extensive number of treatment options, and is a complaint that carries with it a high risk of complications and morbidity.  The differential diagnosis includes cva, tia, seizure, neuropathy   Co morbidities that complicate the patient evaluation  seizures, dm, and hld   Additional history obtained:  Additional history obtained from epic chart review External records from outside source obtained and reviewed including family   Lab Tests:  I Ordered, and personally interpreted labs.  The pertinent results include:  cmp nl other than glucose elevated  at 187, cbc nl   Imaging Studies ordered:  I ordered imaging studies including ct head/mri  I independently visualized and interpreted imaging which showed  CT head: No evidence of acute intracranial abnormality.  I agree with the radiologist interpretation   Cardiac Monitoring:  The patient was maintained on a cardiac monitor.  I personally viewed and interpreted the cardiac monitored which showed an underlying rhythm of: nsr   Medicines ordered and prescription drug management:  I have reviewed the patients home medicines and have made adjustments as needed   Test Considered:  mri   Problem List / ED Course:  TIA vs weakness:  pt is back to normal now.  MRI normal.  I wanted him to be admitted for TIA work up, but he wants to go home.  He has an appt with pcp soon.  He is encouraged to f/u with his neurologist (Dr. Karel Jarvis).  He is to return at any time for worsening of sx.    Reevaluation:  After the interventions noted above, I reevaluated the patient and found that they have :improved   Social Determinants of Health:  Lives at home   Dispostion:  After consideration of the diagnostic results and the patients response to treatment, I feel that the patent would benefit from discharge with outpatient f/u.          Final Clinical Impression(s) / ED Diagnoses Final  diagnoses:  Weakness    Rx / DC Orders ED Discharge Orders          Ordered    Ambulatory referral to Neurology       Comments: An appointment is requested in approximately: 1 week   06/05/22 2123              Jacalyn Lefevre, MD 06/05/22 2126

## 2022-06-05 NOTE — Discharge Instructions (Signed)
You will need an outpatient work up for TIA symptoms.  Make sure to follow up with your doctor to get this done.

## 2022-06-05 NOTE — ED Triage Notes (Signed)
Patient reports 11pm last night episode of not knowing his surrounds with numbness weakness tingling to left side.  Reports he was knocked out asleep afterwards.  Reports now his left sided feels sore and increased fatigue.

## 2022-06-05 NOTE — ED Provider Triage Note (Signed)
Emergency Medicine Provider Triage Evaluation Note  Donald Simmons , a 47 y.o. male  was evaluated in triage.  Pt complains of extremity weakness.  Patient reports that he is sent by primary care provider for evaluation for possible stroke.  No prior history of any strokes or blood clots.  Not currently on blood thinners.  Patient reports that last night he began to feel off and somewhat altered and experienced left-sided altered sensation.  Does have a prior history significant for neuropathy, chronic bilateral low back pain, chronic left shoulder pain.   Review of Systems  Positive: As above Negative: As above  Physical Exam  BP (!) 137/103 (BP Location: Left Arm)   Pulse 97   Temp 98.8 F (37.1 C) (Oral)   Resp 16   Ht 6\' 2"  (1.88 m)   Wt 81.6 kg   SpO2 97%   BMI 23.11 kg/m  Gen:   Awake, no distress   Resp:  Normal effort  MSK:   Moves extremities without difficulty. Other:  CN III-XII intact.  No slurred speech, no facial droop, no arm drift.  Some weakness in left lower extremity compared to right lower extremity but patient does endorse some low back pain that has been worsening recently.  Medical Decision Making  Medically screening exam initiated at 1:50 PM.  Appropriate orders placed.  Lowella Fairy was informed that the remainder of the evaluation will be completed by another provider, this initial triage assessment does not replace that evaluation, and the importance of remaining in the ED until their evaluation is complete.     Smitty Knudsen, PA-C 06/05/22 1355

## 2022-06-09 ENCOUNTER — Ambulatory Visit: Payer: Medicaid Other | Admitting: Neurology

## 2022-06-13 ENCOUNTER — Encounter: Payer: Self-pay | Admitting: Neurology

## 2022-06-13 ENCOUNTER — Ambulatory Visit (INDEPENDENT_AMBULATORY_CARE_PROVIDER_SITE_OTHER): Payer: Medicaid Other | Admitting: Neurology

## 2022-06-13 VITALS — BP 118/81 | HR 89 | Ht 74.0 in | Wt 187.2 lb

## 2022-06-13 DIAGNOSIS — G459 Transient cerebral ischemic attack, unspecified: Secondary | ICD-10-CM | POA: Diagnosis not present

## 2022-06-13 DIAGNOSIS — F32A Depression, unspecified: Secondary | ICD-10-CM | POA: Diagnosis not present

## 2022-06-13 DIAGNOSIS — G40009 Localization-related (focal) (partial) idiopathic epilepsy and epileptic syndromes with seizures of localized onset, not intractable, without status epilepticus: Secondary | ICD-10-CM

## 2022-06-13 MED ORDER — CLOPIDOGREL BISULFATE 75 MG PO TABS
75.0000 mg | ORAL_TABLET | Freq: Every day | ORAL | 11 refills | Status: AC
Start: 2022-06-13 — End: ?

## 2022-06-13 NOTE — Patient Instructions (Addendum)
Good to see you  Schedule CTA head and neck with and without contrast  2. Schedule echocardiogram  3. Schedule 1-hour EEG  4. Start Clopidogrel (Plavix) 75mg  daily  5. Discuss control of cholesterol levels, continued glucose level control with PCP  6. Referral will be sent to [facility]  7. Keep a calendar of your symptoms  8. Follow-up in 3 months, call for any changes
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
## 2022-06-19 ENCOUNTER — Other Ambulatory Visit: Payer: Medicaid Other

## 2022-06-24 ENCOUNTER — Ambulatory Visit (INDEPENDENT_AMBULATORY_CARE_PROVIDER_SITE_OTHER): Payer: Medicaid Other | Admitting: Neurology

## 2022-06-24 DIAGNOSIS — G40009 Localization-related (focal) (partial) idiopathic epilepsy and epileptic syndromes with seizures of localized onset, not intractable, without status epilepticus: Secondary | ICD-10-CM | POA: Diagnosis not present

## 2022-06-24 DIAGNOSIS — F32A Depression, unspecified: Secondary | ICD-10-CM | POA: Diagnosis not present

## 2022-06-24 DIAGNOSIS — G459 Transient cerebral ischemic attack, unspecified: Secondary | ICD-10-CM | POA: Diagnosis not present

## 2022-06-24 NOTE — Progress Notes (Signed)
EEG complete - results pending 

## 2022-07-04 NOTE — Procedures (Signed)
ELECTROENCEPHALOGRAM REPORT  Date of Study: 06/24/2022  Patient's Name: Donald Simmons MRN: [id number] Date of Birth: 1975/09/21  Referring Provider: Dr. Patrcia Dolly  Clinical History: This is a 47 year old man with seizures and recent episode of left-sided numbness. EEG for classification.  Medications: Plavix, Januvia  Technical Summary: A multichannel digital 1-hour EEG recording measured by the international 10-20 system with electrodes applied with paste and impedances below 5000 ohms performed in our laboratory with EKG monitoring in an awake and asleep patient.  Hyperventilation was not performed. Photic stimulation was performed.  The digital EEG was referentially recorded, reformatted, and digitally filtered in a variety of bipolar and referential montages for optimal display.    Description: The patient is awake and asleep during the recording.  During maximal wakefulness, there is a symmetric, medium voltage 9 Hz posterior dominant rhythm that attenuates with eye opening.  The record is symmetric.  During drowsiness and sleep, there is an increase in theta slowing of the background.  Vertex waves and symmetric sleep spindles were seen. Photic stimulation did not elicit any abnormalities.  There were no epileptiform discharges or electrographic seizures seen.    EKG lead was unremarkable.  Impression: This 1-hour awake and asleep EEG is normal.    Clinical Correlation: A normal EEG does not exclude a clinical diagnosis of epilepsy.  If further clinical questions remain, prolonged EEG may be helpful.  Clinical correlation is advised.   Patrcia Dolly, M.D.

## 2022-07-09 ENCOUNTER — Other Ambulatory Visit: Payer: Medicaid Other

## 2022-07-09 ENCOUNTER — Telehealth: Payer: Self-pay

## 2022-07-09 NOTE — Telephone Encounter (Signed)
-----   Message from Van Clines, MD sent at 07/07/2022 12:24 PM EDT ----- Pls let him know EEG is normal, proceed with CTA head and neck and echocardiogram as scheduled, thanks

## 2022-07-09 NOTE — Telephone Encounter (Signed)
Pt called informed that EEG is normal, proceed with CTA head and neck and echocardiogram as scheduled

## 2022-07-14 ENCOUNTER — Ambulatory Visit (HOSPITAL_COMMUNITY): Admission: RE | Admit: 2022-07-14 | Payer: Medicaid Other | Source: Ambulatory Visit

## 2022-07-24 ENCOUNTER — Ambulatory Visit (HOSPITAL_COMMUNITY)
Admission: RE | Admit: 2022-07-24 | Discharge: 2022-07-24 | Disposition: A | Discharge status: Home / Self Care | Payer: Medicaid Other | Source: Ambulatory Visit | Attending: Neurology | Admitting: Neurology

## 2022-07-24 DIAGNOSIS — G459 Transient cerebral ischemic attack, unspecified: Secondary | ICD-10-CM | POA: Diagnosis present

## 2022-07-24 DIAGNOSIS — E119 Type 2 diabetes mellitus without complications: Secondary | ICD-10-CM | POA: Diagnosis not present

## 2022-07-24 DIAGNOSIS — E785 Hyperlipidemia, unspecified: Secondary | ICD-10-CM | POA: Insufficient documentation

## 2022-07-24 LAB — ECHOCARDIOGRAM COMPLETE
AR max vel: 3.13 cm2
AV Area VTI: 3.21 cm2
AV Area mean vel: 2.97 cm2
AV Mean grad: 3 mmHg
AV Peak grad: 5.7 mmHg
Ao pk vel: 1.2 m/s
Area-P 1/2: 3.42 cm2
Calc EF: 64.4 %
S' Lateral: 2.9 cm
Single Plane A2C EF: 55.4 %
Single Plane A4C EF: 71.8 %

## 2022-07-31 ENCOUNTER — Inpatient Hospital Stay: Admission: RE | Admit: 2022-07-31 | Payer: Medicaid Other | Source: Ambulatory Visit

## 2022-07-31 ENCOUNTER — Telehealth: Payer: Self-pay

## 2022-07-31 ENCOUNTER — Other Ambulatory Visit: Payer: Medicaid Other

## 2022-07-31 NOTE — Telephone Encounter (Signed)
Pt called no answer left a voice mail and sent a mychart echocardiogram showed normal function, no clot.

## 2022-07-31 NOTE — Telephone Encounter (Signed)
-----   Message from Van Clines, MD sent at 07/28/2022  4:08 PM EDT ----- Pls let him know echocardiogram showed normal function, no clot. Thanks

## 2022-08-22 ENCOUNTER — Other Ambulatory Visit: Payer: Medicaid Other

## 2022-09-02 ENCOUNTER — Other Ambulatory Visit: Payer: Medicaid Other

## 2022-09-15 ENCOUNTER — Ambulatory Visit: Payer: Medicaid Other | Admitting: Neurology

## 2022-10-02 ENCOUNTER — Ambulatory Visit: Payer: Medicaid Other | Admitting: Neurology

## 2023-02-20 ENCOUNTER — Encounter: Payer: Self-pay | Admitting: Neurology

## 2023-04-12 ENCOUNTER — Encounter: Payer: Self-pay | Admitting: Neurology

## 2023-04-30 ENCOUNTER — Encounter: Payer: Self-pay | Admitting: Neurology

## 2023-08-20 ENCOUNTER — Encounter (HOSPITAL_COMMUNITY): Payer: Self-pay | Admitting: Emergency Medicine

## 2023-08-20 ENCOUNTER — Emergency Department (HOSPITAL_COMMUNITY)

## 2023-08-20 ENCOUNTER — Other Ambulatory Visit: Payer: Self-pay

## 2023-08-20 ENCOUNTER — Emergency Department (HOSPITAL_COMMUNITY)
Admission: EM | Admit: 2023-08-20 | Discharge: 2023-08-20 | Discharge status: Against Medical Advice | Attending: Emergency Medicine | Admitting: Emergency Medicine

## 2023-08-20 DIAGNOSIS — R0789 Other chest pain: Secondary | ICD-10-CM | POA: Insufficient documentation

## 2023-08-20 DIAGNOSIS — R0602 Shortness of breath: Secondary | ICD-10-CM | POA: Diagnosis not present

## 2023-08-20 DIAGNOSIS — Z5321 Procedure and treatment not carried out due to patient leaving prior to being seen by health care provider: Secondary | ICD-10-CM | POA: Diagnosis not present

## 2023-08-20 LAB — BASIC METABOLIC PANEL WITH GFR
Anion gap: 11 (ref 5–15)
BUN: 8 mg/dL (ref 6–20)
CO2: 26 mmol/L (ref 22–32)
Calcium: 9 mg/dL (ref 8.9–10.3)
Chloride: 97 mmol/L — ABNORMAL LOW (ref 98–111)
Creatinine, Ser: 0.74 mg/dL (ref 0.61–1.24)
GFR, Estimated: >60 mL/min (ref >60)
Glucose, Bld: 267 mg/dL — ABNORMAL HIGH (ref 70–99)
Potassium: 4.4 mmol/L (ref 3.5–5.1)
Sodium: 134 mmol/L — ABNORMAL LOW (ref 135–145)

## 2023-08-20 LAB — CBC
HCT: 44.8 % (ref 39.0–52.0)
Hemoglobin: 15.3 g/dL (ref 13.0–17.0)
MCH: 30.8 pg (ref 26.0–34.0)
MCHC: 34.2 g/dL (ref 30.0–36.0)
MCV: 90.1 fL (ref 80.0–100.0)
Platelets: 229 K/uL (ref 150–400)
RBC: 4.97 MIL/uL (ref 4.22–5.81)
RDW: 13.6 % (ref 11.5–15.5)
WBC: 4.4 K/uL (ref 4.0–10.5)
nRBC: 0 % (ref 0.0–0.2)

## 2023-08-20 LAB — TROPONIN I (HIGH SENSITIVITY): Troponin I (High Sensitivity): 4 ng/L (ref <18)

## 2023-08-20 NOTE — ED Notes (Signed)
 Family and patient reports they are leaving as they can't wait any longer. NAD ambulatory with steady gait.

## 2023-08-20 NOTE — ED Triage Notes (Addendum)
 Patient c/o central chest pressure and sob that started 30 mins PTA. Patient states pain worsens with deep breathing and talking. Patient gives verbal consent for MSE.

## 2023-09-25 ENCOUNTER — Encounter: Payer: Self-pay | Admitting: Neurology

## 2023-09-25 ENCOUNTER — Ambulatory Visit: Admitting: Neurology
